# Patient Record
Sex: Female | Born: 1960 | Race: Black or African American | Hispanic: No | Marital: Single | Smoking: Former smoker
Health system: Southern US, Community
[De-identification: ages and names within clinical notes are randomized; demographics above are authoritative.]

## PROBLEM LIST (undated history)

## (undated) DIAGNOSIS — T148XXA Other injury of unspecified body region, initial encounter: Secondary | ICD-10-CM

## (undated) DIAGNOSIS — E559 Vitamin D deficiency, unspecified: Secondary | ICD-10-CM

## (undated) DIAGNOSIS — R05 Cough: Secondary | ICD-10-CM

## (undated) DIAGNOSIS — R519 Headache, unspecified: Secondary | ICD-10-CM

## (undated) DIAGNOSIS — E876 Hypokalemia: Secondary | ICD-10-CM

## (undated) DIAGNOSIS — R059 Cough, unspecified: Secondary | ICD-10-CM

## (undated) DIAGNOSIS — K5790 Diverticulosis of intestine, part unspecified, without perforation or abscess without bleeding: Secondary | ICD-10-CM

## (undated) DIAGNOSIS — I10 Essential (primary) hypertension: Secondary | ICD-10-CM

## (undated) DIAGNOSIS — S060X9A Concussion with loss of consciousness of unspecified duration, initial encounter: Secondary | ICD-10-CM

## (undated) DIAGNOSIS — K759 Inflammatory liver disease, unspecified: Secondary | ICD-10-CM

## (undated) DIAGNOSIS — F32A Depression, unspecified: Secondary | ICD-10-CM

## (undated) DIAGNOSIS — K219 Gastro-esophageal reflux disease without esophagitis: Secondary | ICD-10-CM

---
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
## 2010-01-18 DIAGNOSIS — S060X9A Concussion with loss of consciousness of unspecified duration, initial encounter: Secondary | ICD-10-CM

## 2010-01-18 DIAGNOSIS — S060XAA Concussion with loss of consciousness status unknown, initial encounter: Secondary | ICD-10-CM

## 2010-01-18 HISTORY — DX: Concussion with loss of consciousness status unknown, initial encounter: S06.0XAA

## 2010-01-18 HISTORY — DX: Concussion with loss of consciousness of unspecified duration, initial encounter: S06.0X9A

## 2018-04-07 ENCOUNTER — Encounter (HOSPITAL_BASED_OUTPATIENT_CLINIC_OR_DEPARTMENT_OTHER): Payer: Self-pay | Admitting: *Deleted

## 2018-04-07 ENCOUNTER — Emergency Department (HOSPITAL_BASED_OUTPATIENT_CLINIC_OR_DEPARTMENT_OTHER)
Admission: EM | Admit: 2018-04-07 | Discharge: 2018-04-07 | Disposition: A | Discharge status: Home / Self Care | Payer: Self-pay | Attending: Emergency Medicine | Admitting: Emergency Medicine

## 2018-04-07 ENCOUNTER — Other Ambulatory Visit: Payer: Self-pay

## 2018-04-07 DIAGNOSIS — L0291 Cutaneous abscess, unspecified: Secondary | ICD-10-CM

## 2018-04-07 DIAGNOSIS — K047 Periapical abscess without sinus: Secondary | ICD-10-CM | POA: Insufficient documentation

## 2018-04-07 MED ORDER — LIDOCAINE HCL URETHRAL/MUCOSAL 2 % EX GEL
1.0000 "application " | Freq: Once | CUTANEOUS | Status: AC
  Administered 2018-04-07: 1 via TOPICAL
  Filled 2018-04-07: qty 20

## 2018-04-07 MED ORDER — AMOXICILLIN-POT CLAVULANATE 875-125 MG PO TABS: 1.0000 | ORAL_TABLET | Freq: Two times a day (BID) | ORAL | 0 refills | Status: AC

## 2018-04-07 NOTE — Discharge Instructions (Addendum)
You were evaluated in the Emergency Department and after careful evaluation, we did not find any emergent condition requiring admission or further testing in the hospital.  Your symptoms today seem to be due to an abscess related to a tooth infection.  Please take the antibiotics as directed and follow-up with a dentist.  Please return to the Emergency Department if you experience any worsening of your condition.  We encourage you to follow up with a primary care provider.  Thank you for allowing [place] to be a part of your care.

## 2018-04-07 NOTE — ED Triage Notes (Addendum)
Pt stated that she has a abcess on the left side of her mouth since yesterday.

## 2018-04-07 NOTE — ED Provider Notes (Signed)
[facility] Emergency Department Provider Note MRN:  [id number]  Arrival date & time: 04/07/18     Chief Complaint   Abscess   History of Present Illness   Sandra Montoya is a 58 y.o. year-old female with a history of tobacco abuse presenting to the ED with chief complaint of abscess.  Worsening tooth pain and swelling for the past 2 days.  Pain is located in the left upper teeth and lip.  Has had tooth abscess in the past.  Denies fever, endorsing mild swelling to the left cheek, pain is constant, 8 out of 10, worse with motion or palpation.  Denies vision problems, no headache, no chest pain or shortness of breath, no abdominal pain.  Review of Systems  A complete 10 system review of systems was obtained and all systems are negative except as noted in the HPI and PMH.   Patient's Health History   History reviewed. No pertinent past medical history.  History reviewed. No pertinent surgical history.  History reviewed. No pertinent family history.  Social History   Socioeconomic History  . Marital status: Single    Spouse name: Not on file  . Number of children: Not on file  . Years of education: Not on file  . Highest education level: Not on file  Occupational History  . Not on file  Social Needs  . Financial resource strain: Not on file  . Food insecurity:    Worry: Not on file    Inability: Not on file  . Transportation needs:    Medical: Not on file    Non-medical: Not on file  Tobacco Use  . Smoking status: Current Every Day Smoker    Packs/day: 0.50  . Smokeless tobacco: Never Used  Substance and Sexual Activity  . Alcohol use: Yes    Alcohol/week: 5.0 standard drinks    Types: 5 Cans of beer per week  . Drug use: Not Currently  . Sexual activity: Not on file  Lifestyle  . Physical activity:    Days per week: Not on file    Minutes per session: Not on file  . Stress: Not on file  Relationships  . Social connections:    Talks on  phone: Not on file    Gets together: Not on file    Attends religious service: Not on file    Active member of club or organization: Not on file    Attends meetings of clubs or organizations: Not on file    Relationship status: Not on file  . Intimate partner violence:    Fear of current or ex partner: Not on file    Emotionally abused: Not on file    Physically abused: Not on file    Forced sexual activity: Not on file  Other Topics Concern  . Not on file  Social History Narrative  . Not on file     Physical Exam  Vital Signs and Nursing Notes reviewed Vitals:   04/07/18 1106 04/07/18 1107  BP:  134/75  Pulse:  86  Resp:  18  Temp: 98 F (36.7 C)   SpO2:  100%    CONSTITUTIONAL: Well-appearing, NAD NEURO:  Alert and oriented x 3, no focal deficits EYES:  eyes equal and reactive ENT/NECK:  no LAD, no JVD; tooth 6 is eroded and decayed, there is a small abscess to the left upper buccal mucosa CARDIO: Regular rate, well-perfused, normal S1 and S2 PULM:  CTAB no wheezing or rhonchi  GI/GU:  normal bowel sounds, non-distended, non-tender MSK/SPINE:  No gross deformities, no edema SKIN:  no rash, atraumatic PSYCH:  Appropriate speech and behavior  Diagnostic and Interventional Summary    Labs Reviewed - No data to display  No orders to display    Medications  lidocaine (XYLOCAINE) 2 % jelly 1 application (1 application Topical Given 04/07/18 1115)     .Marland KitchenIncision and Drainage Date/Time: 04/07/2018 11:12 AM Performed by: Sabas Sous, MD Authorized by: Sabas Sous, MD   Consent:    Consent obtained:  Verbal   Consent given by:  Patient   Risks discussed:  Incomplete drainage and bleeding Location:    Type:  Abscess   Size:  34mm   Location:  Mouth   Mouth location: buccal mucosa. Anesthesia (see MAR for exact dosages):    Anesthesia method:  Topical application   Topical anesthetic:  Lidocaine gel Procedure type:    Complexity:  Simple Procedure  details:    Incision types:  Stab incision   Incision depth:  Submucosal   Scalpel blade:  11   Drainage:  Bloody   Drainage amount:  Scant   Packing materials:  None Post-procedure details:    Patient tolerance of procedure:  Tolerated well, no immediate complications   Critical Care  ED Course and Medical Decision Making  I have reviewed the triage vital signs and the nursing notes.  Pertinent labs & imaging results that were available during my care of the patient were reviewed by me and considered in my medical decision making (see below for details).  Uncomplicated dental abscess in this 58 year old female with poor dentition at baseline.  Patient is without fever, no significant contiguous swelling, no airway concerns, nothing to suggest Ludwig's.  Incision and drainage performed as described above, prescription for Augmentin.  After the discussed management above, the patient was determined to be safe for discharge.  The patient was in agreement with this plan and all questions regarding their care were answered.  ED return precautions were discussed and the patient will return to the ED with any significant worsening of condition.  Elmer Sow. Pilar Plate, MD [facility] Emergency Medicine [facility] [email or web address]  Final Clinical Impressions(s) / ED Diagnoses     ICD-10-CM   1. Abscess L02.91     ED Discharge Orders         Ordered    amoxicillin-clavulanate (AUGMENTIN) 875-125 MG tablet  Every 12 hours     04/07/18 1134             Sabas Sous, MD 04/07/18 1135

## 2018-08-06 ENCOUNTER — Emergency Department (HOSPITAL_COMMUNITY): Payer: Self-pay

## 2018-08-06 ENCOUNTER — Inpatient Hospital Stay (HOSPITAL_COMMUNITY)
Admission: EM | Admit: 2018-08-06 | Discharge: 2018-08-09 | DRG: 492 | Disposition: A | Discharge status: Rehabilitation Facility | Payer: Self-pay | Attending: General Surgery | Admitting: General Surgery

## 2018-08-06 ENCOUNTER — Inpatient Hospital Stay (HOSPITAL_COMMUNITY): Payer: Self-pay

## 2018-08-06 DIAGNOSIS — S42322A Displaced transverse fracture of shaft of humerus, left arm, initial encounter for closed fracture: Principal | ICD-10-CM | POA: Diagnosis present

## 2018-08-06 DIAGNOSIS — Z20828 Contact with and (suspected) exposure to other viral communicable diseases: Secondary | ICD-10-CM | POA: Diagnosis present

## 2018-08-06 DIAGNOSIS — T148XXA Other injury of unspecified body region, initial encounter: Secondary | ICD-10-CM

## 2018-08-06 DIAGNOSIS — S3219XA Other fracture of sacrum, initial encounter for closed fracture: Secondary | ICD-10-CM | POA: Diagnosis present

## 2018-08-06 DIAGNOSIS — Z79899 Other long term (current) drug therapy: Secondary | ICD-10-CM

## 2018-08-06 DIAGNOSIS — Z880 Allergy status to penicillin: Secondary | ICD-10-CM

## 2018-08-06 DIAGNOSIS — Y92410 Unspecified street and highway as the place of occurrence of the external cause: Secondary | ICD-10-CM

## 2018-08-06 DIAGNOSIS — R402132 Coma scale, eyes open, to sound, at arrival to emergency department: Secondary | ICD-10-CM | POA: Diagnosis present

## 2018-08-06 DIAGNOSIS — I4581 Long QT syndrome: Secondary | ICD-10-CM | POA: Diagnosis present

## 2018-08-06 DIAGNOSIS — R471 Dysarthria and anarthria: Secondary | ICD-10-CM | POA: Diagnosis present

## 2018-08-06 DIAGNOSIS — S2249XA Multiple fractures of ribs, unspecified side, initial encounter for closed fracture: Secondary | ICD-10-CM

## 2018-08-06 DIAGNOSIS — E559 Vitamin D deficiency, unspecified: Secondary | ICD-10-CM | POA: Diagnosis not present

## 2018-08-06 DIAGNOSIS — F10129 Alcohol abuse with intoxication, unspecified: Secondary | ICD-10-CM | POA: Diagnosis present

## 2018-08-06 DIAGNOSIS — D62 Acute posthemorrhagic anemia: Secondary | ICD-10-CM | POA: Diagnosis present

## 2018-08-06 DIAGNOSIS — Z419 Encounter for procedure for purposes other than remedying health state, unspecified: Secondary | ICD-10-CM

## 2018-08-06 DIAGNOSIS — R402362 Coma scale, best motor response, obeys commands, at arrival to emergency department: Secondary | ICD-10-CM | POA: Diagnosis present

## 2018-08-06 DIAGNOSIS — Y908 Blood alcohol level of 240 mg/100 ml or more: Secondary | ICD-10-CM | POA: Diagnosis present

## 2018-08-06 DIAGNOSIS — R402252 Coma scale, best verbal response, oriented, at arrival to emergency department: Secondary | ICD-10-CM | POA: Diagnosis present

## 2018-08-06 DIAGNOSIS — I517 Cardiomegaly: Secondary | ICD-10-CM | POA: Diagnosis present

## 2018-08-06 DIAGNOSIS — S92061B Displaced intraarticular fracture of right calcaneus, initial encounter for open fracture: Secondary | ICD-10-CM | POA: Diagnosis present

## 2018-08-06 DIAGNOSIS — F17210 Nicotine dependence, cigarettes, uncomplicated: Secondary | ICD-10-CM | POA: Diagnosis present

## 2018-08-06 DIAGNOSIS — S2242XA Multiple fractures of ribs, left side, initial encounter for closed fracture: Secondary | ICD-10-CM | POA: Diagnosis present

## 2018-08-06 DIAGNOSIS — S32592B Other specified fracture of left pubis, initial encounter for open fracture: Secondary | ICD-10-CM

## 2018-08-06 DIAGNOSIS — S42302A Unspecified fracture of shaft of humerus, left arm, initial encounter for closed fracture: Secondary | ICD-10-CM

## 2018-08-06 DIAGNOSIS — Z23 Encounter for immunization: Secondary | ICD-10-CM

## 2018-08-06 DIAGNOSIS — S92001B Unspecified fracture of right calcaneus, initial encounter for open fracture: Secondary | ICD-10-CM

## 2018-08-06 HISTORY — DX: Essential (primary) hypertension: I10

## 2018-08-06 HISTORY — DX: Other injury of unspecified body region, initial encounter: T14.8XXA

## 2018-08-06 HISTORY — DX: Hypokalemia: E87.6

## 2018-08-06 LAB — TYPE AND SCREEN
ABO/RH(D): O NEG
Antibody Screen: NEGATIVE

## 2018-08-06 LAB — URINALYSIS, ROUTINE W REFLEX MICROSCOPIC
Bacteria, UA: NONE SEEN
Bilirubin Urine: NEGATIVE
Glucose, UA: NEGATIVE mg/dL
Ketones, ur: NEGATIVE mg/dL
Nitrite: NEGATIVE
Protein, ur: NEGATIVE mg/dL
Specific Gravity, Urine: 1.011 (ref 1.005–1.030)
pH: 5 (ref 5.0–8.0)

## 2018-08-06 LAB — COMPREHENSIVE METABOLIC PANEL
ALT: 40 U/L (ref 0–44)
AST: 72 U/L — ABNORMAL HIGH (ref 15–41)
Albumin: 3.6 g/dL (ref 3.5–5.0)
Alkaline Phosphatase: 58 U/L (ref 38–126)
Anion gap: 15 (ref 5–15)
BUN: 12 mg/dL (ref 6–20)
CO2: 14 mmol/L — ABNORMAL LOW (ref 22–32)
Calcium: 8 mg/dL — ABNORMAL LOW (ref 8.9–10.3)
Chloride: 104 mmol/L (ref 98–111)
Creatinine, Ser: 0.87 mg/dL (ref 0.44–1.00)
GFR calc Af Amer: >60 mL/min (ref >60)
GFR calc non Af Amer: >60 mL/min (ref >60)
Glucose, Bld: 112 mg/dL — ABNORMAL HIGH (ref 70–99)
Potassium: 2.8 mmol/L — ABNORMAL LOW (ref 3.5–5.1)
Sodium: 133 mmol/L — ABNORMAL LOW (ref 135–145)
Total Bilirubin: 0.6 mg/dL (ref 0.3–1.2)
Total Protein: 6.6 g/dL (ref 6.5–8.1)

## 2018-08-06 LAB — CBC
HCT: 34.4 % — ABNORMAL LOW (ref 36.0–46.0)
Hemoglobin: 11.1 g/dL — ABNORMAL LOW (ref 12.0–15.0)
MCH: 32 pg (ref 26.0–34.0)
MCHC: 32.3 g/dL (ref 30.0–36.0)
MCV: 99.1 fL (ref 80.0–100.0)
Platelets: 319 10*3/uL (ref 150–400)
RBC: 3.47 MIL/uL — ABNORMAL LOW (ref 3.87–5.11)
RDW: 13.5 % (ref 11.5–15.5)
WBC: 10.1 10*3/uL (ref 4.0–10.5)
nRBC: 0 % (ref 0.0–0.2)

## 2018-08-06 LAB — I-STAT CHEM 8, ED
BUN: 12 mg/dL (ref 6–20)
Calcium, Ion: 0.95 mmol/L — ABNORMAL LOW (ref 1.15–1.40)
Chloride: 107 mmol/L (ref 98–111)
Creatinine, Ser: 1.3 mg/dL — ABNORMAL HIGH (ref 0.44–1.00)
Glucose, Bld: 113 mg/dL — ABNORMAL HIGH (ref 70–99)
HCT: 36 % (ref 36.0–46.0)
Hemoglobin: 12.2 g/dL (ref 12.0–15.0)
Potassium: 2.9 mmol/L — ABNORMAL LOW (ref 3.5–5.1)
Sodium: 137 mmol/L (ref 135–145)
TCO2: 17 mmol/L — ABNORMAL LOW (ref 22–32)

## 2018-08-06 LAB — ETHANOL: Alcohol, Ethyl (B): 346 mg/dL (ref <10)

## 2018-08-06 LAB — PROTIME-INR
INR: 1.2 (ref 0.8–1.2)
Prothrombin Time: 15.2 seconds (ref 11.4–15.2)

## 2018-08-06 LAB — RAPID URINE DRUG SCREEN, HOSP PERFORMED
Amphetamines: NOT DETECTED
Barbiturates: NOT DETECTED
Benzodiazepines: NOT DETECTED
Cocaine: NOT DETECTED
Opiates: NOT DETECTED
Tetrahydrocannabinol: NOT DETECTED

## 2018-08-06 LAB — I-STAT BETA HCG BLOOD, ED (MC, WL, AP ONLY): I-stat hCG, quantitative: <5 m[IU]/mL (ref <5)

## 2018-08-06 LAB — LACTIC ACID, PLASMA: Lactic Acid, Venous: 2 mmol/L (ref 0.5–1.9)

## 2018-08-06 LAB — SARS CORONAVIRUS 2 BY RT PCR (HOSPITAL ORDER, PERFORMED IN ~~LOC~~ HOSPITAL LAB): SARS Coronavirus 2: NEGATIVE

## 2018-08-06 MED ORDER — LACTATED RINGERS IV BOLUS
1000.0000 mL | Freq: Once | INTRAVENOUS | Status: AC
  Administered 2018-08-07: 1000 mL via INTRAVENOUS

## 2018-08-06 MED ORDER — TETANUS-DIPHTH-ACELL PERTUSSIS 5-2.5-18.5 LF-MCG/0.5 IM SUSP: 0.5000 mL | Freq: Once | INTRAMUSCULAR | Status: DC

## 2018-08-06 MED ORDER — CEFAZOLIN SODIUM-DEXTROSE 2-4 GM/100ML-% IV SOLN: 2.0000 g | Freq: Once | INTRAVENOUS | Status: DC

## 2018-08-06 MED ORDER — TETANUS-DIPHTH-ACELL PERTUSSIS 5-2.5-18.5 LF-MCG/0.5 IM SUSP
0.5000 mL | Freq: Once | INTRAMUSCULAR | Status: AC
  Administered 2018-08-06: 0.5 mL via INTRAMUSCULAR
  Filled 2018-08-06: qty 0.5

## 2018-08-06 MED ORDER — CEFAZOLIN SODIUM-DEXTROSE 2-4 GM/100ML-% IV SOLN
2.0000 g | Freq: Once | INTRAVENOUS | Status: AC
  Administered 2018-08-06: 1 g via INTRAVENOUS
  Filled 2018-08-06: qty 100

## 2018-08-06 MED ORDER — IOHEXOL 300 MG/ML  SOLN
100.0000 mL | Freq: Once | INTRAMUSCULAR | Status: AC | PRN
  Administered 2018-08-06: 100 mL via INTRAVENOUS

## 2018-08-06 MED ORDER — CEFAZOLIN SODIUM-DEXTROSE 1-4 GM/50ML-% IV SOLN
1.0000 g | Freq: Once | INTRAVENOUS | Status: DC
  Filled 2018-08-06: qty 50
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
## 2018-08-06 NOTE — ED Notes (Signed)
Ortho paged and responded. 
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
## 2018-08-07 ENCOUNTER — Inpatient Hospital Stay (HOSPITAL_COMMUNITY): Payer: Self-pay

## 2018-08-07 ENCOUNTER — Inpatient Hospital Stay (HOSPITAL_COMMUNITY): Payer: Self-pay | Admitting: Anesthesiology

## 2018-08-07 ENCOUNTER — Encounter (HOSPITAL_COMMUNITY): Payer: Self-pay | Admitting: Anesthesiology

## 2018-08-07 ENCOUNTER — Encounter (HOSPITAL_COMMUNITY): Admission: EM | Disposition: A | Discharge status: Rehabilitation Facility | Payer: Self-pay | Source: Home / Self Care

## 2018-08-07 DIAGNOSIS — S2249XA Multiple fractures of ribs, unspecified side, initial encounter for closed fracture: Secondary | ICD-10-CM

## 2018-08-07 DIAGNOSIS — S32592B Other specified fracture of left pubis, initial encounter for open fracture: Secondary | ICD-10-CM

## 2018-08-07 DIAGNOSIS — S42302A Unspecified fracture of shaft of humerus, left arm, initial encounter for closed fracture: Secondary | ICD-10-CM

## 2018-08-07 DIAGNOSIS — S92001B Unspecified fracture of right calcaneus, initial encounter for open fracture: Secondary | ICD-10-CM

## 2018-08-07 HISTORY — PX: ORIF HUMERUS FRACTURE: SHX2126

## 2018-08-07 HISTORY — PX: ORIF CALCANEOUS FRACTURE: SHX5030

## 2018-08-07 HISTORY — PX: IRRIGATION AND DEBRIDEMENT BUTTOCKS: SHX6601

## 2018-08-07 HISTORY — PX: I & D EXTREMITY: SHX5045

## 2018-08-07 LAB — MRSA PCR SCREENING: MRSA by PCR: NEGATIVE

## 2018-08-07 LAB — CBC
HCT: 34.6 % — ABNORMAL LOW (ref 36.0–46.0)
Hemoglobin: 11.5 g/dL — ABNORMAL LOW (ref 12.0–15.0)
MCH: 32.2 pg (ref 26.0–34.0)
MCHC: 33.2 g/dL (ref 30.0–36.0)
MCV: 96.9 fL (ref 80.0–100.0)
Platelets: 299 10*3/uL (ref 150–400)
RBC: 3.57 MIL/uL — ABNORMAL LOW (ref 3.87–5.11)
RDW: 13.2 % (ref 11.5–15.5)
WBC: 12.5 10*3/uL — ABNORMAL HIGH (ref 4.0–10.5)
nRBC: 0 % (ref 0.0–0.2)

## 2018-08-07 LAB — CDS SEROLOGY

## 2018-08-07 LAB — HIV ANTIBODY (ROUTINE TESTING W REFLEX): HIV Screen 4th Generation wRfx: NONREACTIVE

## 2018-08-07 LAB — ABO/RH: ABO/RH(D): O NEG

## 2018-08-07 SURGERY — IRRIGATION AND DEBRIDEMENT EXTREMITY
Anesthesia: General | Site: Foot | Laterality: Right

## 2018-08-07 MED ORDER — HYDROMORPHONE HCL 1 MG/ML IJ SOLN
1.0000 mg | INTRAMUSCULAR | Status: DC | PRN
  Administered 2018-08-07 – 2018-08-09 (×4): 1 mg via INTRAVENOUS
  Filled 2018-08-07 (×4): qty 1

## 2018-08-07 MED ORDER — MIDAZOLAM HCL 2 MG/2ML IJ SOLN
INTRAMUSCULAR | Status: AC
  Filled 2018-08-07: qty 2

## 2018-08-07 MED ORDER — ALBUMIN HUMAN 5 % IV SOLN
INTRAVENOUS | Status: DC | PRN
  Administered 2018-08-07: 10:00:00 via INTRAVENOUS

## 2018-08-07 MED ORDER — OXYCODONE HCL 5 MG PO TABS
5.0000 mg | ORAL_TABLET | ORAL | Status: DC | PRN
  Filled 2018-08-07 (×3): qty 2

## 2018-08-07 MED ORDER — SUCCINYLCHOLINE CHLORIDE 200 MG/10ML IV SOSY
PREFILLED_SYRINGE | INTRAVENOUS | Status: DC | PRN
  Administered 2018-08-07: 140 mg via INTRAVENOUS

## 2018-08-07 MED ORDER — SODIUM CHLORIDE 0.9 % IV SOLN
2.0000 g | INTRAVENOUS | Status: AC
  Administered 2018-08-07 – 2018-08-09 (×3): 2 g via INTRAVENOUS
  Filled 2018-08-07: qty 20
  Filled 2018-08-07 (×2): qty 2

## 2018-08-07 MED ORDER — CEFAZOLIN SODIUM-DEXTROSE 2-3 GM-%(50ML) IV SOLR
INTRAVENOUS | Status: DC | PRN
  Administered 2018-08-07: 2 g via INTRAVENOUS

## 2018-08-07 MED ORDER — ENOXAPARIN SODIUM 40 MG/0.4ML ~~LOC~~ SOLN
40.0000 mg | SUBCUTANEOUS | Status: DC
  Administered 2018-08-08 – 2018-08-09 (×2): 40 mg via SUBCUTANEOUS
  Filled 2018-08-07 (×2): qty 0.4

## 2018-08-07 MED ORDER — ONDANSETRON HCL 4 MG/2ML IJ SOLN
INTRAMUSCULAR | Status: DC | PRN
  Administered 2018-08-07: 4 mg via INTRAVENOUS

## 2018-08-07 MED ORDER — VANCOMYCIN HCL 1000 MG IV SOLR
INTRAVENOUS | Status: AC
  Filled 2018-08-07: qty 1000

## 2018-08-07 MED ORDER — LACTATED RINGERS IV SOLN
INTRAVENOUS | Status: DC
  Administered 2018-08-07 – 2018-08-09 (×5): via INTRAVENOUS

## 2018-08-07 MED ORDER — OXYCODONE HCL 5 MG PO TABS
ORAL_TABLET | ORAL | Status: AC
  Filled 2018-08-07: qty 1

## 2018-08-07 MED ORDER — THIAMINE HCL 100 MG/ML IJ SOLN: 100.0000 mg | Freq: Every day | INTRAMUSCULAR | Status: DC

## 2018-08-07 MED ORDER — PHENYLEPHRINE 40 MCG/ML (10ML) SYRINGE FOR IV PUSH (FOR BLOOD PRESSURE SUPPORT)
PREFILLED_SYRINGE | INTRAVENOUS | Status: AC
  Filled 2018-08-07: qty 10

## 2018-08-07 MED ORDER — LACTATED RINGERS IV SOLN
INTRAVENOUS | Status: DC | PRN
  Administered 2018-08-07 (×2): via INTRAVENOUS

## 2018-08-07 MED ORDER — ENOXAPARIN SODIUM 40 MG/0.4ML ~~LOC~~ SOLN: 40.0000 mg | Freq: Every day | SUBCUTANEOUS | Status: DC

## 2018-08-07 MED ORDER — CEFAZOLIN SODIUM-DEXTROSE 2-4 GM/100ML-% IV SOLN
2.0000 g | Freq: Once | INTRAVENOUS | Status: DC
  Filled 2018-08-07: qty 100

## 2018-08-07 MED ORDER — ACETAMINOPHEN 325 MG PO TABS
650.0000 mg | ORAL_TABLET | Freq: Four times a day (QID) | ORAL | Status: DC
  Administered 2018-08-08 – 2018-08-09 (×7): 650 mg via ORAL
  Filled 2018-08-07 (×8): qty 2

## 2018-08-07 MED ORDER — OXYCODONE HCL 5 MG PO TABS
10.0000 mg | ORAL_TABLET | ORAL | Status: DC | PRN
  Administered 2018-08-07 – 2018-08-08 (×3): 10 mg via ORAL
  Administered 2018-08-08: 15 mg via ORAL
  Administered 2018-08-08 – 2018-08-09 (×4): 10 mg via ORAL
  Filled 2018-08-07: qty 3
  Filled 2018-08-07 (×5): qty 2

## 2018-08-07 MED ORDER — DEXAMETHASONE SODIUM PHOSPHATE 10 MG/ML IJ SOLN
INTRAMUSCULAR | Status: DC | PRN
  Administered 2018-08-07: 8 mg via INTRAVENOUS

## 2018-08-07 MED ORDER — FENTANYL CITRATE (PF) 100 MCG/2ML IJ SOLN
25.0000 ug | INTRAMUSCULAR | Status: DC | PRN
  Administered 2018-08-07 (×3): 50 ug via INTRAVENOUS

## 2018-08-07 MED ORDER — ADULT MULTIVITAMIN W/MINERALS CH
1.0000 | ORAL_TABLET | Freq: Every day | ORAL | Status: DC
  Administered 2018-08-08 – 2018-08-09 (×2): 1 via ORAL
  Filled 2018-08-07 (×2): qty 1

## 2018-08-07 MED ORDER — ROCURONIUM BROMIDE 10 MG/ML (PF) SYRINGE
PREFILLED_SYRINGE | INTRAVENOUS | Status: AC
  Filled 2018-08-07: qty 10

## 2018-08-07 MED ORDER — ONDANSETRON HCL 4 MG/2ML IJ SOLN: 4.0000 mg | Freq: Four times a day (QID) | INTRAMUSCULAR | Status: DC | PRN

## 2018-08-07 MED ORDER — MIDAZOLAM HCL 5 MG/5ML IJ SOLN
INTRAMUSCULAR | Status: DC | PRN
  Administered 2018-08-07 (×2): 1 mg via INTRAVENOUS

## 2018-08-07 MED ORDER — OXYCODONE HCL 5 MG PO TABS: 10.0000 mg | ORAL_TABLET | Freq: Four times a day (QID) | ORAL | Status: DC | PRN

## 2018-08-07 MED ORDER — ROCURONIUM BROMIDE 10 MG/ML (PF) SYRINGE
PREFILLED_SYRINGE | INTRAVENOUS | Status: DC | PRN
  Administered 2018-08-07: 20 mg via INTRAVENOUS
  Administered 2018-08-07: 50 mg via INTRAVENOUS
  Administered 2018-08-07: 20 mg via INTRAVENOUS

## 2018-08-07 MED ORDER — PROPOFOL 10 MG/ML IV BOLUS
INTRAVENOUS | Status: DC | PRN
  Administered 2018-08-07: 140 mg via INTRAVENOUS

## 2018-08-07 MED ORDER — IBUPROFEN 200 MG PO TABS: 600.0000 mg | ORAL_TABLET | Freq: Four times a day (QID) | ORAL | Status: DC | PRN

## 2018-08-07 MED ORDER — LIDOCAINE 2% (20 MG/ML) 5 ML SYRINGE
INTRAMUSCULAR | Status: DC | PRN
  Administered 2018-08-07: 100 mg via INTRAVENOUS

## 2018-08-07 MED ORDER — DEXMEDETOMIDINE HCL IN NACL 200 MCG/50ML IV SOLN
0.2000 ug/kg/h | INTRAVENOUS | Status: DC
  Filled 2018-08-07: qty 50

## 2018-08-07 MED ORDER — PROPOFOL 10 MG/ML IV BOLUS
INTRAVENOUS | Status: AC
  Filled 2018-08-07: qty 20

## 2018-08-07 MED ORDER — TOBRAMYCIN SULFATE 1.2 G IJ SOLR
INTRAMUSCULAR | Status: DC | PRN
  Administered 2018-08-07 (×2): 1.2 g via TOPICAL

## 2018-08-07 MED ORDER — 0.9 % SODIUM CHLORIDE (POUR BTL) OPTIME
TOPICAL | Status: DC | PRN
  Administered 2018-08-07: 1000 mL
  Administered 2018-08-07: 6000 mL

## 2018-08-07 MED ORDER — OXYCODONE HCL 5 MG/5ML PO SOLN: 5.0000 mg | Freq: Once | ORAL | Status: AC | PRN

## 2018-08-07 MED ORDER — TOBRAMYCIN SULFATE 1.2 G IJ SOLR
INTRAMUSCULAR | Status: AC
  Filled 2018-08-07: qty 1.2

## 2018-08-07 MED ORDER — FOLIC ACID 1 MG PO TABS
1.0000 mg | ORAL_TABLET | Freq: Every day | ORAL | Status: DC
  Administered 2018-08-08 – 2018-08-09 (×2): 1 mg via ORAL
  Filled 2018-08-07 (×2): qty 1

## 2018-08-07 MED ORDER — HYDROMORPHONE HCL 1 MG/ML IJ SOLN: 0.5000 mg | INTRAMUSCULAR | Status: DC | PRN

## 2018-08-07 MED ORDER — BACITRACIN ZINC 500 UNIT/GM EX OINT
TOPICAL_OINTMENT | CUTANEOUS | Status: AC
  Filled 2018-08-07: qty 28.35

## 2018-08-07 MED ORDER — DOCUSATE SODIUM 100 MG PO CAPS
200.0000 mg | ORAL_CAPSULE | Freq: Two times a day (BID) | ORAL | Status: DC
  Administered 2018-08-07 – 2018-08-09 (×4): 200 mg via ORAL
  Filled 2018-08-07 (×4): qty 2

## 2018-08-07 MED ORDER — FENTANYL CITRATE (PF) 250 MCG/5ML IJ SOLN
INTRAMUSCULAR | Status: DC | PRN
  Administered 2018-08-07: 100 ug via INTRAVENOUS
  Administered 2018-08-07 (×2): 50 ug via INTRAVENOUS
  Administered 2018-08-07 (×2): 25 ug via INTRAVENOUS

## 2018-08-07 MED ORDER — ONDANSETRON HCL 4 MG/2ML IJ SOLN
INTRAMUSCULAR | Status: AC
  Filled 2018-08-07: qty 2

## 2018-08-07 MED ORDER — DEXAMETHASONE SODIUM PHOSPHATE 10 MG/ML IJ SOLN
INTRAMUSCULAR | Status: AC
  Filled 2018-08-07: qty 1

## 2018-08-07 MED ORDER — VITAMIN B-1 100 MG PO TABS
100.0000 mg | ORAL_TABLET | Freq: Every day | ORAL | Status: DC
  Administered 2018-08-08 – 2018-08-09 (×2): 100 mg via ORAL
  Filled 2018-08-07 (×2): qty 1

## 2018-08-07 MED ORDER — LORAZEPAM 2 MG/ML IJ SOLN: 1.0000 mg | INTRAMUSCULAR | Status: DC | PRN

## 2018-08-07 MED ORDER — LIDOCAINE-EPINEPHRINE (PF) 2 %-1:200000 IJ SOLN
20.0000 mL | Freq: Once | INTRAMUSCULAR | Status: AC
  Administered 2018-08-07: 20 mL via INTRADERMAL
  Filled 2018-08-07: qty 20

## 2018-08-07 MED ORDER — FENTANYL CITRATE (PF) 250 MCG/5ML IJ SOLN
INTRAMUSCULAR | Status: AC
  Filled 2018-08-07: qty 5

## 2018-08-07 MED ORDER — VANCOMYCIN HCL 1000 MG IV SOLR
INTRAVENOUS | Status: DC | PRN
  Administered 2018-08-07 (×2): 1000 mg via TOPICAL

## 2018-08-07 MED ORDER — OXYCODONE HCL 5 MG PO TABS
5.0000 mg | ORAL_TABLET | Freq: Once | ORAL | Status: AC | PRN
  Administered 2018-08-07: 12:00:00 5 mg via ORAL

## 2018-08-07 MED ORDER — ONDANSETRON 4 MG PO TBDP: 4.0000 mg | ORAL_TABLET | Freq: Four times a day (QID) | ORAL | Status: DC | PRN

## 2018-08-07 MED ORDER — ONDANSETRON HCL 4 MG/2ML IJ SOLN: 4.0000 mg | Freq: Once | INTRAMUSCULAR | Status: DC | PRN

## 2018-08-07 MED ORDER — SUGAMMADEX SODIUM 200 MG/2ML IV SOLN
INTRAVENOUS | Status: DC | PRN
  Administered 2018-08-07: 200 mg via INTRAVENOUS

## 2018-08-07 MED ORDER — LIDOCAINE 2% (20 MG/ML) 5 ML SYRINGE
INTRAMUSCULAR | Status: AC
  Filled 2018-08-07: qty 5

## 2018-08-07 MED ORDER — FENTANYL CITRATE (PF) 100 MCG/2ML IJ SOLN
INTRAMUSCULAR | Status: AC
  Filled 2018-08-07: qty 2

## 2018-08-07 MED ORDER — HYDRALAZINE HCL 20 MG/ML IJ SOLN: 10.0000 mg | INTRAMUSCULAR | Status: DC | PRN

## 2018-08-07 MED ORDER — LORAZEPAM 2 MG/ML IJ SOLN: 1.0000 mg | Freq: Four times a day (QID) | INTRAMUSCULAR | Status: DC | PRN

## 2018-08-07 MED ORDER — ALUM & MAG HYDROXIDE-SIMETH 200-200-20 MG/5ML PO SUSP
30.0000 mL | ORAL | Status: DC | PRN
  Administered 2018-08-07 – 2018-08-09 (×6): 30 mL via ORAL
  Filled 2018-08-07 (×6): qty 30

## 2018-08-07 MED ORDER — SUCCINYLCHOLINE CHLORIDE 200 MG/10ML IV SOSY
PREFILLED_SYRINGE | INTRAVENOUS | Status: AC
  Filled 2018-08-07: qty 10

## 2018-08-07 MED ORDER — FENTANYL CITRATE (PF) 100 MCG/2ML IJ SOLN
INTRAMUSCULAR | Status: AC | PRN
  Administered 2018-08-07: 50 ug via INTRAVENOUS

## 2018-08-07 MED ORDER — POTASSIUM CHLORIDE 10 MEQ/100ML IV SOLN
10.0000 meq | INTRAVENOUS | Status: AC
  Administered 2018-08-07 (×6): 10 meq via INTRAVENOUS
  Filled 2018-08-07 (×6): qty 100

## 2018-08-07 MED ORDER — LORAZEPAM 1 MG PO TABS: 1.0000 mg | ORAL_TABLET | Freq: Four times a day (QID) | ORAL | Status: DC | PRN

## 2018-08-07 MED ORDER — PNEUMOCOCCAL VAC POLYVALENT 25 MCG/0.5ML IJ INJ
0.5000 mL | INJECTION | INTRAMUSCULAR | Status: AC
  Administered 2018-08-08: 13:00:00 0.5 mL via INTRAMUSCULAR
  Filled 2018-08-07: qty 0.5

## 2018-08-07 SURGICAL SUPPLY — 92 items
BIT DRILL CANN LRG QC 5X300 (BIT) ×2 IMPLANT
BIT DRILL Q/COUPLING 1 (BIT) ×2 IMPLANT
BLADE AVERAGE 25MMX9MM (BLADE)
BLADE AVERAGE 25X9 (BLADE) IMPLANT
BNDG COHESIVE 4X5 TAN STRL (GAUZE/BANDAGES/DRESSINGS) ×4 IMPLANT
BNDG ELASTIC 4X5.8 VLCR STR LF (GAUZE/BANDAGES/DRESSINGS) ×2 IMPLANT
BNDG ESMARK 4X9 LF (GAUZE/BANDAGES/DRESSINGS) ×4 IMPLANT
BNDG GAUZE ELAST 4 BULKY (GAUZE/BANDAGES/DRESSINGS) ×8 IMPLANT
BRUSH SCRUB EZ PLAIN DRY (MISCELLANEOUS) ×12 IMPLANT
CANISTER WOUND CARE 500ML ATS (WOUND CARE) ×2 IMPLANT
CHLORAPREP W/TINT 26 (MISCELLANEOUS) ×10 IMPLANT
CORD BIPOLAR FORCEPS 12FT (ELECTRODE) ×6 IMPLANT
COVER MAYO STAND STRL (DRAPES) ×6 IMPLANT
COVER SURGICAL LIGHT HANDLE (MISCELLANEOUS) ×12 IMPLANT
COVER WAND RF STERILE (DRAPES) ×4 IMPLANT
DRAIN PENROSE 1/4X12 LTX STRL (WOUND CARE) IMPLANT
DRAPE C-ARM 42X72 X-RAY (DRAPES) ×8 IMPLANT
DRAPE C-ARMOR (DRAPES) ×4 IMPLANT
DRAPE HALF SHEET 40X57 (DRAPES) ×8 IMPLANT
DRAPE INCISE IOBAN 66X45 STRL (DRAPES) IMPLANT
DRAPE ORTHO SPLIT 77X108 STRL (DRAPES) ×4
DRAPE SURG 17X23 STRL (DRAPES) ×4 IMPLANT
DRAPE SURG ORHT 6 SPLT 77X108 (DRAPES) ×4 IMPLANT
DRAPE U-SHAPE 47X51 STRL (DRAPES) ×12 IMPLANT
DRESSING PEEL AND PLC PRVNA 13 (GAUZE/BANDAGES/DRESSINGS) IMPLANT
DRSG ADAPTIC 3X8 NADH LF (GAUZE/BANDAGES/DRESSINGS) ×4 IMPLANT
DRSG MEPILEX BORDER 4X8 (GAUZE/BANDAGES/DRESSINGS) ×2 IMPLANT
DRSG PAD ABDOMINAL 8X10 ST (GAUZE/BANDAGES/DRESSINGS) ×4 IMPLANT
DRSG PEEL AND PLACE PREVENA 13 (GAUZE/BANDAGES/DRESSINGS) ×6
DRSG TEGADERM 4X4.75 (GAUZE/BANDAGES/DRESSINGS) ×2 IMPLANT
ELECT REM PT RETURN 9FT ADLT (ELECTROSURGICAL) ×6
ELECTRODE REM PT RTRN 9FT ADLT (ELECTROSURGICAL) ×4 IMPLANT
EVACUATOR 1/8 PVC DRAIN (DRAIN) IMPLANT
GAUZE SPONGE 4X4 12PLY STRL (GAUZE/BANDAGES/DRESSINGS) ×8 IMPLANT
GLOVE BIO SURGEON STRL SZ 6.5 (GLOVE) ×14 IMPLANT
GLOVE BIO SURGEON STRL SZ7.5 (GLOVE) ×28 IMPLANT
GLOVE BIO SURGEONS STRL SZ 6.5 (GLOVE) ×2
GLOVE BIOGEL PI IND STRL 6.5 (GLOVE) ×4 IMPLANT
GLOVE BIOGEL PI IND STRL 7.5 (GLOVE) ×4 IMPLANT
GLOVE BIOGEL PI IND STRL 8 (GLOVE) ×4 IMPLANT
GLOVE BIOGEL PI INDICATOR 6.5 (GLOVE) ×4
GLOVE BIOGEL PI INDICATOR 7.5 (GLOVE) ×4
GLOVE BIOGEL PI INDICATOR 8 (GLOVE)
GOWN STRL REUS W/ TWL LRG LVL3 (GOWN DISPOSABLE) ×8 IMPLANT
GOWN STRL REUS W/ TWL XL LVL3 (GOWN DISPOSABLE) ×4 IMPLANT
GOWN STRL REUS W/TWL LRG LVL3 (GOWN DISPOSABLE) ×8
GOWN STRL REUS W/TWL XL LVL3 (GOWN DISPOSABLE) ×2
GUIDEWIRE THREADED 2.8 (WIRE) ×6 IMPLANT
HANDPIECE INTERPULSE COAX TIP (DISPOSABLE)
KIT BASIN OR (CUSTOM PROCEDURE TRAY) ×6 IMPLANT
KIT PREVENA INCISION MGT 13 (CANNISTER) ×2 IMPLANT
KIT TURNOVER KIT B (KITS) ×6 IMPLANT
MANIFOLD NEPTUNE II (INSTRUMENTS) ×8 IMPLANT
NDL HYPO 25X1 1.5 SAFETY (NEEDLE) ×4 IMPLANT
NEEDLE HYPO 25X1 1.5 SAFETY (NEEDLE) ×6 IMPLANT
NS IRRIG 1000ML POUR BTL (IV SOLUTION) ×6 IMPLANT
PACK ORTHO EXTREMITY (CUSTOM PROCEDURE TRAY) ×6 IMPLANT
PAD ARMBOARD 7.5X6 YLW CONV (MISCELLANEOUS) ×12 IMPLANT
PAD CAST 4YDX4 CTTN HI CHSV (CAST SUPPLIES) IMPLANT
PADDING CAST COTTON 4X4 STRL (CAST SUPPLIES) ×2
PADDING CAST COTTON 6X4 STRL (CAST SUPPLIES) ×4 IMPLANT
PLATE LOCKING 8 HOLE (Plate) ×2 IMPLANT
SCREW CANN 32MM 6.5X60MM (Screw) ×2 IMPLANT
SCREW CANN 32MM 6.5X70MM (Screw) ×2 IMPLANT
SCREW CORTEX ST 4.5X24 (Screw) ×2 IMPLANT
SCREW CORTEX ST 4.5X26 (Screw) ×8 IMPLANT
SCREW CORTEX ST 4.5X28 (Screw) ×2 IMPLANT
SET HNDPC FAN SPRY TIP SCT (DISPOSABLE) IMPLANT
SPONGE LAP 18X18 RF (DISPOSABLE) ×8 IMPLANT
STAPLER VISISTAT 35W (STAPLE) ×6 IMPLANT
STOCKINETTE IMPERVIOUS 9X36 MD (GAUZE/BANDAGES/DRESSINGS) IMPLANT
SUCTION FRAZIER HANDLE 10FR (MISCELLANEOUS) ×2
SUCTION TUBE FRAZIER 10FR DISP (MISCELLANEOUS) ×4 IMPLANT
SUT ETHILON 2 0 FS 18 (SUTURE) ×12 IMPLANT
SUT ETHILON 3 0 PS 1 (SUTURE) ×12 IMPLANT
SUT MON AB 2-0 CT1 36 (SUTURE) ×6 IMPLANT
SUT PDS AB 0 CT 36 (SUTURE) IMPLANT
SUT VIC AB 0 CT1 27 (SUTURE)
SUT VIC AB 0 CT1 27XBRD ANBCTR (SUTURE) ×8 IMPLANT
SUT VIC AB 2-0 CT1 27 (SUTURE)
SUT VIC AB 2-0 CT1 TAPERPNT 27 (SUTURE) ×8 IMPLANT
SWAB CULTURE ESWAB REG 1ML (MISCELLANEOUS) IMPLANT
SYR 5ML LL (SYRINGE) IMPLANT
SYR CONTROL 10ML LL (SYRINGE) ×4 IMPLANT
TOWEL GREEN STERILE (TOWEL DISPOSABLE) ×18 IMPLANT
TOWEL GREEN STERILE FF (TOWEL DISPOSABLE) ×6 IMPLANT
TRAY FOLEY MTR SLVR 16FR STAT (SET/KITS/TRAYS/PACK) IMPLANT
TUBE CONNECTING 12'X1/4 (SUCTIONS) ×2
TUBE CONNECTING 12X1/4 (SUCTIONS) ×6 IMPLANT
UNDERPAD 30X30 (UNDERPADS AND DIAPERS) ×6 IMPLANT
WATER STERILE IRR 1000ML POUR (IV SOLUTION) ×6 IMPLANT
YANKAUER SUCT BULB TIP NO VENT (SUCTIONS) ×8 IMPLANT

## 2018-08-07 NOTE — Transfer of Care (Signed)
Immediate Anesthesia Transfer of Care Note  Patient: Sandra Montoya  Procedure(s) Performed: IRRIGATION AND DEBRIDEMENT EXTREMITY (Right Foot) OPEN REDUCTION INTERNAL FIXATION (ORIF) DISTAL HUMERUS FRACTURE (Left Arm Upper) Open Reduction Internal Fixation (Orif) Calcaneous Fracture (Right Ankle) Irrigation And Debridement Buttocks (N/A )  Patient Location: PACU  Anesthesia Type:General  Level of Consciousness: awake, alert  and oriented  Airway & Oxygen Therapy: Patient Spontanous Breathing and Patient connected to nasal cannula oxygen  Post-op Assessment: Report given to RN and Post -op Vital signs reviewed and stable  Post vital signs: Reviewed and stable  Last Vitals:  Vitals Value Taken Time  BP 153/79 08/07/18 1144  Temp    Pulse 87 08/07/18 1146  Resp 17 08/07/18 1146  SpO2 100 % 08/07/18 1146  Vitals shown include unvalidated device data.  Last Pain:  Vitals:   08/07/18 0507  TempSrc: Oral  PainSc:          Complications: No apparent anesthesia complications

## 2018-08-07 NOTE — Anesthesia Preprocedure Evaluation (Signed)
Anesthesia Evaluation  Patient identified by MRN, date of birth, ID band Patient confused    Reviewed: Allergy & Precautions, NPO status , Patient's Chart, lab work & pertinent test results  Airway Mallampati: II   Neck ROM: Limited    Dental  (+) Dental Advisory Given, Teeth Intact   Pulmonary Current Smoker,    breath sounds clear to auscultation       Cardiovascular  Rhythm:Regular Rate:Normal     Neuro/Psych    GI/Hepatic   Endo/Other    Renal/GU      Musculoskeletal   Abdominal   Peds  Hematology   Anesthesia Other Findings   Reproductive/Obstetrics                             Anesthesia Physical Anesthesia Plan  ASA: III and emergent  Anesthesia Plan: General   Post-op Pain Management:    Induction: Intravenous  PONV Risk Score and Plan:   Airway Management Planned:   Additional Equipment:   Intra-op Plan:   Post-operative Plan: Extubation in OR  Informed Consent: I have reviewed the patients History and Physical, chart, labs and discussed the procedure including the risks, benefits and alternatives for the proposed anesthesia with the patient or authorized representative who has indicated his/her understanding and acceptance.     Dental advisory given  Plan Discussed with: CRNA and Anesthesiologist  Anesthesia Plan Comments:         Anesthesia Quick Evaluation

## 2018-08-07 NOTE — Progress Notes (Signed)
Attempted video chat using phone number and email, unsuccessful. Called family member at home with instructions.

## 2018-08-07 NOTE — Progress Notes (Signed)
The patient's daughter requested the waiting room volunteer call up to the unit and  the family needing the patient's purse with her keys, so that they could tend to her dog and house.  Pearline Yerby (daughter) was given the patient's black purse.

## 2018-08-07 NOTE — Progress Notes (Signed)
OT Cancellation Note  Patient Details Name: Sandra Montoya MRN: [id number] DOB: 09/18/1960   Cancelled Treatment:    Reason Eval/Treat Not Completed: Patient at procedure or test/ unavailable(in OR for I&D)  Sandra Montoya 08/07/2018, 8:26 AM   Sandra Montoya OTR/L Acute Rehabilitation Services Pager: [phone] Office: [phone]

## 2018-08-07 NOTE — Progress Notes (Signed)
PT Cancellation Note  Patient Details Name: Sandra Montoya MRN: [id number] DOB: April 28, 1960   Cancelled Treatment:    Reason Eval/Treat Not Completed: Patient at procedure or test/unavailable Pt currently in OR. Will follow up as schedule allows.   Leighton Ruff, PT, DPT  Acute Rehabilitation Services  Pager: [phone] Office: [phone]  Rudean Hitt 08/07/2018, 10:30 AM
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
## 2018-08-07 NOTE — Progress Notes (Signed)
Talked with pt;s daughter and unable to do video chat via her phone or email. Discussed with Marlowe Kays RN at bedside for pt's daughter to call her (patient) on the phone.
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
## 2018-08-07 NOTE — Anesthesia Procedure Notes (Signed)
Procedure Name: Intubation Date/Time: 08/07/2018 7:54 AM Performed by: Marsa Aris, CRNA Pre-anesthesia Checklist: Patient identified, Emergency Drugs available, Suction available and Patient being monitored Patient Re-evaluated:Patient Re-evaluated prior to induction Oxygen Delivery Method: Circle System Utilized Preoxygenation: Pre-oxygenation with 100% oxygen Induction Type: IV induction and Rapid sequence Laryngoscope Size: Glidescope and 3 Grade View: Grade I Tube type: Oral Tube size: 7.0 mm Number of attempts: 1 Airway Equipment and Method: Stylet and Bite block Placement Confirmation: ETT inserted through vocal cords under direct vision,  positive ETCO2 and breath sounds checked- equal and bilateral Secured at: 21 cm Tube secured with: Tape Dental Injury: Teeth and Oropharynx as per pre-operative assessment  Comments: C-spine neutrality maintained - no change in dentition from pre-procedure

## 2018-08-07 NOTE — Anesthesia Postprocedure Evaluation (Signed)
Anesthesia Post Note  Patient: Sandra Montoya  Procedure(s) Performed: IRRIGATION AND DEBRIDEMENT EXTREMITY (Right Foot) OPEN REDUCTION INTERNAL FIXATION (ORIF) DISTAL HUMERUS FRACTURE (Left Arm Upper) Open Reduction Internal Fixation (Orif) Calcaneous Fracture (Right Ankle) Irrigation And Debridement Buttocks (N/A )     Patient location during evaluation: PACU Anesthesia Type: General Level of consciousness: sedated, patient cooperative, oriented and lethargic Pain management: pain level controlled Vital Signs Assessment: post-procedure vital signs reviewed and stable Respiratory status: spontaneous breathing, nonlabored ventilation, respiratory function stable and patient connected to nasal cannula oxygen Cardiovascular status: blood pressure returned to baseline and stable Postop Assessment: no apparent nausea or vomiting Anesthetic complications: no    Last Vitals:  Vitals:   08/07/18 1230 08/07/18 1240  BP: (!) 166/83 (!) 166/84  Pulse: 80 87  Resp: 16 16  Temp:  (!) 36.2 C  SpO2: 97% 92%    Last Pain:  Vitals:   08/07/18 1240  TempSrc:   PainSc: Asleep        RLE Motor Response: Purposeful movement;Responds to commands (08/07/18 1240)        Kyaira Trantham COKER
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
## 2018-08-08 ENCOUNTER — Encounter (HOSPITAL_COMMUNITY): Payer: Self-pay | Admitting: Student

## 2018-08-08 ENCOUNTER — Inpatient Hospital Stay (HOSPITAL_COMMUNITY): Payer: Self-pay

## 2018-08-08 LAB — CBC
HCT: 25.5 % — ABNORMAL LOW (ref 36.0–46.0)
Hemoglobin: 8.5 g/dL — ABNORMAL LOW (ref 12.0–15.0)
MCH: 32.1 pg (ref 26.0–34.0)
MCHC: 33.3 g/dL (ref 30.0–36.0)
MCV: 96.2 fL (ref 80.0–100.0)
Platelets: 267 10*3/uL (ref 150–400)
RBC: 2.65 MIL/uL — ABNORMAL LOW (ref 3.87–5.11)
RDW: 13.3 % (ref 11.5–15.5)
WBC: 11.2 10*3/uL — ABNORMAL HIGH (ref 4.0–10.5)
nRBC: 0 % (ref 0.0–0.2)

## 2018-08-08 LAB — VITAMIN D 25 HYDROXY (VIT D DEFICIENCY, FRACTURES): Vit D, 25-Hydroxy: 15.1 ng/mL — ABNORMAL LOW (ref 30.0–100.0)

## 2018-08-08 MED ORDER — GABAPENTIN 600 MG PO TABS
300.0000 mg | ORAL_TABLET | Freq: Three times a day (TID) | ORAL | Status: DC
  Administered 2018-08-08 – 2018-08-09 (×4): 300 mg via ORAL
  Filled 2018-08-08 (×2): qty 1
  Filled 2018-08-08 (×3): qty 0.5

## 2018-08-08 MED ORDER — CHLORHEXIDINE GLUCONATE CLOTH 2 % EX PADS
6.0000 | MEDICATED_PAD | Freq: Every day | CUTANEOUS | Status: DC
  Administered 2018-08-08 – 2018-08-09 (×2): 6 via TOPICAL

## 2018-08-08 MED ORDER — METHOCARBAMOL 500 MG PO TABS
750.0000 mg | ORAL_TABLET | Freq: Three times a day (TID) | ORAL | Status: DC | PRN
  Administered 2018-08-08 – 2018-08-09 (×3): 750 mg via ORAL
  Filled 2018-08-08 (×3): qty 2

## 2018-08-08 NOTE — Progress Notes (Signed)
Rehab Admissions Coordinator Note:  Patient was screened by Michel Santee, PT, DPT for appropriateness for an Inpatient Acute Rehab Consult.  At this time, we are recommending Inpatient Rehab consult. I will contact Trauma PA for order.   Michel Santee 08/08/2018, 10:27 AM  I can be reached at [phone]
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
## 2018-08-08 NOTE — Progress Notes (Signed)
Inpatient Rehab Admissions:  Inpatient Rehab Consult received.  I met with patient at the bedside for rehabilitation assessment and to discuss goals and expectations of an inpatient rehab admission.  She is open to CIR.  We did discuss self-pay status versus medicaid and she would like to be contacted by financial counselor to discuss medicaid eligibility.  I will contact her daughter to confirm possible assist and home set up.  Pt may be able to reach mod I w/c level goals if her home is w/c accessible.  Will continue to follow for timing of possible rehab admission pending family support.    Signed: Caitlin Warren, PT, DPT Admissions Coordinator [phone] 08/08/18  4:02 PM     
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
## 2018-08-08 NOTE — Progress Notes (Signed)
Orthopedic Tech Progress Note Patient Details:  Sandra Montoya 08-Oct-1960 [id number]  Ortho Devices Type of Ortho Device: CAM walker Ortho Device/Splint Location: right Ortho Device/Splint Interventions: Application   Post Interventions Patient Tolerated: Well Instructions Provided: Care of device   [place] Pink 08/08/2018, 11:07 AM
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
## 2018-08-09 ENCOUNTER — Encounter (HOSPITAL_COMMUNITY): Payer: Self-pay | Admitting: *Deleted

## 2018-08-09 ENCOUNTER — Inpatient Hospital Stay (HOSPITAL_COMMUNITY)
Admission: RE | Admit: 2018-08-09 | Discharge: 2018-08-19 | DRG: 560 | Disposition: A | Discharge status: Home Health | Payer: Self-pay | Source: Intra-hospital | Attending: Physical Medicine & Rehabilitation | Admitting: Physical Medicine & Rehabilitation

## 2018-08-09 ENCOUNTER — Other Ambulatory Visit: Payer: Self-pay

## 2018-08-09 ENCOUNTER — Encounter (HOSPITAL_COMMUNITY): Payer: Self-pay | Admitting: Physical Medicine and Rehabilitation

## 2018-08-09 DIAGNOSIS — K5900 Constipation, unspecified: Secondary | ICD-10-CM | POA: Diagnosis present

## 2018-08-09 DIAGNOSIS — Z825 Family history of asthma and other chronic lower respiratory diseases: Secondary | ICD-10-CM

## 2018-08-09 DIAGNOSIS — E559 Vitamin D deficiency, unspecified: Secondary | ICD-10-CM | POA: Diagnosis present

## 2018-08-09 DIAGNOSIS — S2249XA Multiple fractures of ribs, unspecified side, initial encounter for closed fracture: Secondary | ICD-10-CM | POA: Diagnosis present

## 2018-08-09 DIAGNOSIS — T1490XA Injury, unspecified, initial encounter: Secondary | ICD-10-CM

## 2018-08-09 DIAGNOSIS — F1010 Alcohol abuse, uncomplicated: Secondary | ICD-10-CM | POA: Diagnosis present

## 2018-08-09 DIAGNOSIS — S92001A Unspecified fracture of right calcaneus, initial encounter for closed fracture: Secondary | ICD-10-CM

## 2018-08-09 DIAGNOSIS — Z87820 Personal history of traumatic brain injury: Secondary | ICD-10-CM

## 2018-08-09 DIAGNOSIS — S2242XD Multiple fractures of ribs, left side, subsequent encounter for fracture with routine healing: Secondary | ICD-10-CM

## 2018-08-09 DIAGNOSIS — K219 Gastro-esophageal reflux disease without esophagitis: Secondary | ICD-10-CM | POA: Diagnosis present

## 2018-08-09 DIAGNOSIS — E876 Hypokalemia: Secondary | ICD-10-CM | POA: Diagnosis present

## 2018-08-09 DIAGNOSIS — S42301D Unspecified fracture of shaft of humerus, right arm, subsequent encounter for fracture with routine healing: Secondary | ICD-10-CM

## 2018-08-09 DIAGNOSIS — I10 Essential (primary) hypertension: Secondary | ICD-10-CM | POA: Diagnosis present

## 2018-08-09 DIAGNOSIS — F17210 Nicotine dependence, cigarettes, uncomplicated: Secondary | ICD-10-CM | POA: Diagnosis present

## 2018-08-09 DIAGNOSIS — Y92410 Unspecified street and highway as the place of occurrence of the external cause: Secondary | ICD-10-CM

## 2018-08-09 DIAGNOSIS — D72829 Elevated white blood cell count, unspecified: Secondary | ICD-10-CM | POA: Diagnosis present

## 2018-08-09 DIAGNOSIS — S92001D Unspecified fracture of right calcaneus, subsequent encounter for fracture with routine healing: Secondary | ICD-10-CM

## 2018-08-09 DIAGNOSIS — D62 Acute posthemorrhagic anemia: Secondary | ICD-10-CM | POA: Diagnosis present

## 2018-08-09 DIAGNOSIS — S3210XD Unspecified fracture of sacrum, subsequent encounter for fracture with routine healing: Secondary | ICD-10-CM

## 2018-08-09 DIAGNOSIS — S42302D Unspecified fracture of shaft of humerus, left arm, subsequent encounter for fracture with routine healing: Secondary | ICD-10-CM

## 2018-08-09 DIAGNOSIS — S32592D Other specified fracture of left pubis, subsequent encounter for fracture with routine healing: Principal | ICD-10-CM

## 2018-08-09 DIAGNOSIS — S32592B Other specified fracture of left pubis, initial encounter for open fracture: Secondary | ICD-10-CM

## 2018-08-09 DIAGNOSIS — R7989 Other specified abnormal findings of blood chemistry: Secondary | ICD-10-CM

## 2018-08-09 LAB — BASIC METABOLIC PANEL
Anion gap: 10 (ref 5–15)
BUN: 6 mg/dL (ref 6–20)
CO2: 27 mmol/L (ref 22–32)
Calcium: 8.2 mg/dL — ABNORMAL LOW (ref 8.9–10.3)
Chloride: 102 mmol/L (ref 98–111)
Creatinine, Ser: 0.7 mg/dL (ref 0.44–1.00)
GFR calc Af Amer: >60 mL/min (ref >60)
GFR calc non Af Amer: >60 mL/min (ref >60)
Glucose, Bld: 116 mg/dL — ABNORMAL HIGH (ref 70–99)
Potassium: 3.3 mmol/L — ABNORMAL LOW (ref 3.5–5.1)
Sodium: 139 mmol/L (ref 135–145)

## 2018-08-09 LAB — CBC
HCT: 27.5 % — ABNORMAL LOW (ref 36.0–46.0)
Hemoglobin: 8.9 g/dL — ABNORMAL LOW (ref 12.0–15.0)
MCH: 31.9 pg (ref 26.0–34.0)
MCHC: 32.4 g/dL (ref 30.0–36.0)
MCV: 98.6 fL (ref 80.0–100.0)
Platelets: 297 10*3/uL (ref 150–400)
RBC: 2.79 MIL/uL — ABNORMAL LOW (ref 3.87–5.11)
RDW: 13.6 % (ref 11.5–15.5)
WBC: 12.1 10*3/uL — ABNORMAL HIGH (ref 4.0–10.5)
nRBC: 0 % (ref 0.0–0.2)

## 2018-08-09 MED ORDER — GUAIFENESIN-DM 100-10 MG/5ML PO SYRP: 5.0000 mL | ORAL_SOLUTION | Freq: Four times a day (QID) | ORAL | Status: DC | PRN

## 2018-08-09 MED ORDER — FOLIC ACID 1 MG PO TABS
1.0000 mg | ORAL_TABLET | Freq: Every day | ORAL | Status: DC
  Administered 2018-08-10 – 2018-08-19 (×10): 1 mg via ORAL
  Filled 2018-08-09 (×10): qty 1

## 2018-08-09 MED ORDER — VITAMIN D 25 MCG (1000 UNIT) PO TABS
2000.0000 [IU] | ORAL_TABLET | Freq: Two times a day (BID) | ORAL | Status: DC
  Administered 2018-08-09 – 2018-08-19 (×20): 2000 [IU] via ORAL
  Filled 2018-08-09 (×20): qty 2

## 2018-08-09 MED ORDER — PROCHLORPERAZINE EDISYLATE 10 MG/2ML IJ SOLN: 5.0000 mg | Freq: Four times a day (QID) | INTRAMUSCULAR | Status: DC | PRN

## 2018-08-09 MED ORDER — TRAMADOL HCL 50 MG PO TABS
50.0000 mg | ORAL_TABLET | Freq: Four times a day (QID) | ORAL | Status: DC | PRN
  Administered 2018-08-16 – 2018-08-18 (×4): 50 mg via ORAL
  Filled 2018-08-09 (×4): qty 1

## 2018-08-09 MED ORDER — ACETAMINOPHEN 325 MG PO TABS
650.0000 mg | ORAL_TABLET | Freq: Four times a day (QID) | ORAL | Status: DC
  Administered 2018-08-09 – 2018-08-19 (×38): 650 mg via ORAL
  Filled 2018-08-09 (×39): qty 2

## 2018-08-09 MED ORDER — POTASSIUM CHLORIDE CRYS ER 20 MEQ PO TBCR
20.0000 meq | EXTENDED_RELEASE_TABLET | Freq: Every day | ORAL | Status: DC
  Administered 2018-08-12 – 2018-08-19 (×8): 20 meq via ORAL
  Filled 2018-08-09 (×8): qty 1

## 2018-08-09 MED ORDER — SENNOSIDES-DOCUSATE SODIUM 8.6-50 MG PO TABS
2.0000 | ORAL_TABLET | Freq: Every day | ORAL | Status: DC
  Administered 2018-08-09 – 2018-08-18 (×10): 2 via ORAL
  Filled 2018-08-09 (×10): qty 2

## 2018-08-09 MED ORDER — OXYCODONE HCL 5 MG PO TABS
5.0000 mg | ORAL_TABLET | ORAL | Status: DC | PRN
  Administered 2018-08-09 – 2018-08-18 (×15): 10 mg via ORAL
  Administered 2018-08-18: 5 mg via ORAL
  Administered 2018-08-19 (×2): 10 mg via ORAL
  Filled 2018-08-09 (×17): qty 2

## 2018-08-09 MED ORDER — PANTOPRAZOLE SODIUM 40 MG PO TBEC
40.0000 mg | DELAYED_RELEASE_TABLET | Freq: Every day | ORAL | Status: DC
  Administered 2018-08-10 – 2018-08-19 (×10): 40 mg via ORAL
  Filled 2018-08-09 (×10): qty 1

## 2018-08-09 MED ORDER — THIAMINE HCL 100 MG/ML IJ SOLN
100.0000 mg | Freq: Every day | INTRAMUSCULAR | Status: DC
  Administered 2018-08-17: 08:00:00 100 mg via INTRAVENOUS
  Filled 2018-08-09 (×10): qty 1

## 2018-08-09 MED ORDER — PROCHLORPERAZINE 25 MG RE SUPP
12.5000 mg | Freq: Four times a day (QID) | RECTAL | Status: DC | PRN
  Filled 2018-08-09: qty 1

## 2018-08-09 MED ORDER — ADULT MULTIVITAMIN W/MINERALS CH
1.0000 | ORAL_TABLET | Freq: Every day | ORAL | Status: DC
  Administered 2018-08-10 – 2018-08-19 (×10): 1 via ORAL
  Filled 2018-08-09 (×10): qty 1

## 2018-08-09 MED ORDER — DIPHENHYDRAMINE HCL 12.5 MG/5ML PO ELIX: 12.5000 mg | ORAL_SOLUTION | Freq: Four times a day (QID) | ORAL | Status: DC | PRN

## 2018-08-09 MED ORDER — METHOCARBAMOL 750 MG PO TABS
750.0000 mg | ORAL_TABLET | Freq: Three times a day (TID) | ORAL | Status: DC | PRN
  Administered 2018-08-10: 750 mg via ORAL
  Filled 2018-08-09: qty 1

## 2018-08-09 MED ORDER — POLYETHYLENE GLYCOL 3350 17 G PO PACK: 17.0000 g | PACK | Freq: Every day | ORAL | Status: DC | PRN

## 2018-08-09 MED ORDER — POTASSIUM CHLORIDE CRYS ER 20 MEQ PO TBCR
20.0000 meq | EXTENDED_RELEASE_TABLET | Freq: Two times a day (BID) | ORAL | Status: AC
  Administered 2018-08-09 – 2018-08-11 (×4): 20 meq via ORAL
  Filled 2018-08-09 (×4): qty 1

## 2018-08-09 MED ORDER — BISACODYL 10 MG RE SUPP: 10.0000 mg | Freq: Every day | RECTAL | Status: DC | PRN

## 2018-08-09 MED ORDER — VITAMIN B-1 100 MG PO TABS
100.0000 mg | ORAL_TABLET | Freq: Every day | ORAL | Status: DC
  Administered 2018-08-10 – 2018-08-19 (×9): 100 mg via ORAL
  Filled 2018-08-09 (×10): qty 1

## 2018-08-09 MED ORDER — ACETAMINOPHEN 325 MG PO TABS
325.0000 mg | ORAL_TABLET | ORAL | Status: DC | PRN
  Administered 2018-08-15 – 2018-08-16 (×2): 650 mg via ORAL
  Filled 2018-08-09 (×2): qty 2

## 2018-08-09 MED ORDER — TRAZODONE HCL 50 MG PO TABS
25.0000 mg | ORAL_TABLET | Freq: Every evening | ORAL | Status: DC | PRN
  Filled 2018-08-09: qty 1

## 2018-08-09 MED ORDER — ALUM & MAG HYDROXIDE-SIMETH 200-200-20 MG/5ML PO SUSP
30.0000 mL | ORAL | Status: DC | PRN
  Administered 2018-08-09: 30 mL via ORAL
  Filled 2018-08-09: qty 30

## 2018-08-09 MED ORDER — VITAMIN D 25 MCG (1000 UNIT) PO TABS
2000.0000 [IU] | ORAL_TABLET | Freq: Two times a day (BID) | ORAL | Status: DC
  Administered 2018-08-09: 2000 [IU] via ORAL
  Filled 2018-08-09: qty 2

## 2018-08-09 MED ORDER — ENOXAPARIN SODIUM 40 MG/0.4ML ~~LOC~~ SOLN
40.0000 mg | SUBCUTANEOUS | Status: DC
  Administered 2018-08-10 – 2018-08-19 (×10): 40 mg via SUBCUTANEOUS
  Filled 2018-08-09 (×10): qty 0.4

## 2018-08-09 MED ORDER — GABAPENTIN 600 MG PO TABS
300.0000 mg | ORAL_TABLET | Freq: Three times a day (TID) | ORAL | Status: DC
  Administered 2018-08-09 – 2018-08-11 (×6): 300 mg via ORAL
  Filled 2018-08-09 (×6): qty 1

## 2018-08-09 MED ORDER — FLEET ENEMA 7-19 GM/118ML RE ENEM: 1.0000 | ENEMA | Freq: Once | RECTAL | Status: DC | PRN

## 2018-08-09 MED ORDER — PROCHLORPERAZINE MALEATE 5 MG PO TABS: 5.0000 mg | ORAL_TABLET | Freq: Four times a day (QID) | ORAL | Status: DC | PRN
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
## 2018-08-09 NOTE — Progress Notes (Signed)
Patient ID: Sandra Montoya, female   DOB: Nov 23, 1960, 58 y.o.   MRN: [id number] Patient arrive from 4N23 via the hospital bed and RN. Patient oriented to room, nurse call system, rehab process, rehab schedule, fall prevention plan, rehab schedule, rehab safety plan, and health resource notebook with verbal understanding. Patient resting in bed with call bell at side, 3 side rails up.
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
## 2018-08-09 NOTE — Discharge Instructions (Signed)
Cast or Splint Care, Adult °Casts and splints are supports that are worn to protect broken bones and other injuries. A cast or splint may hold a bone still and in the correct position while it heals. Casts and splints may also help to ease pain, swelling, and muscle spasms. °How to care for your cast ° °· Do not stick anything inside the cast to scratch your skin. °· Check the skin around the cast every day. Tell your doctor about any concerns. °· You may put lotion on dry skin around the edges of the cast. Do not put lotion on the skin under the cast. °· Keep the cast clean. °· If the cast is not waterproof: °? Do not let it get wet. °? Cover it with a watertight covering when you take a bath or a shower. °How to care for your splint ° °· Wear it as told by your doctor. Take it off only as told by your doctor. °· Loosen the splint if your fingers or toes tingle, get numb, or turn cold and blue. °· Keep the splint clean. °· If the splint is not waterproof: °? Do not let it get wet. °? Cover it with a watertight covering when you take a bath or a shower. °Follow these instructions at home: °Bathing °· Do not take baths or swim until your doctor says it is okay. Ask your doctor if you can take showers. You may only be allowed to take sponge baths for bathing. °· If your cast or splint is not waterproof, cover it with a watertight covering when you take a bath or shower. °Managing pain, stiffness, and swelling °· Move your fingers or toes often to avoid stiffness and to lessen swelling. °· Raise (elevate) the injured area above the level of your heart while sitting or lying down. °Safety °· Do not use the injured limb to support your body weight until your doctor says that it is okay. °· Use crutches or other assistive devices as told by your doctor. °General instructions °· Do not put pressure on any part of the cast or splint until it is fully hardened. This may take many hours. °· Return to your normal activities as  told by your doctor. Ask your doctor what activities are safe for you. °· Keep all follow-up visits as told by your doctor. This is important. °Contact a doctor if: °· Your cast or splint gets damaged. °· The skin around the cast gets red or raw. °· The skin under the cast is very itchy or painful. °· Your cast or splint feels very uncomfortable. °· Your cast or splint is too tight or too loose. °· Your cast becomes wet or it starts to have a soft spot or area. °· You get an object stuck under your cast. °Get help right away if: °· Your pain gets worse. °· The injured area tingles, gets numb, or turns blue and cold. °· The part of your body above or below the cast is swollen and it turns a different color (is discolored). °· You cannot feel or move your fingers or toes. °· There is fluid leaking through the cast. °· You have very bad pain or pressure under the cast. °· You have trouble breathing. °· You have shortness of breath. °· You have chest pain. °This information is not intended to replace advice given to you by your health care provider. Make sure you discuss any questions you have with your health care provider. °Document Released:   05/06/2010 Document Revised: 04/26/2018 Document Reviewed: 12/26/2015 Elsevier Patient Education  [place].   How To Use a Sling A sling is a type of hanging bandage that is worn around the neck to protect an injured arm, shoulder, or other body part. A sling may be needed to prevent movement of (to immobilize) the injured body part while it heals. Keeping the injured body part still can lessen pain and speed up healing. A health care provider may recommend using a sling:  To treat a broken arm or collarbone.  To treat a shoulder injury.  After surgery. What are the risks? In general, wearing a sling helps with safe healing. However, in some cases, wearing a sling the wrong way can:  Make the injury worse.  Cause stiffness or numbness.  Affect blood  flow (circulation) in the arm and hand. This can cause tingling or numbness in the fingers or hands. How to use a sling Follow instructions from your health care provider about how and when to wear your sling. Your health care provider will show you or tell you:  How to put on the sling.  How to adjust the sling.  When and how often to wear the sling.  How to remove the sling. The way that you need to use a sling depends on your injury. Unless your health care provider gives you different instructions, you should:  Wear the sling so that your elbow bends to the shape of a capital letter "L" (90 degrees, or a right angle).  Make sure the sling supports your wrist and your hand.  Adjust the sling if your fingers or hand start to tingle or feel numb. Follow these instructions at home:  Try to avoid moving your arm.  Do not twist, lift, or move your arm in a way that could make your injury worse.  Do not lean on your arm while wearing a sling.  Do not lift anything with the hand or arm that is in the sling. Contact a health care provider if:  You have bruising, swelling, or pain that gets worse.  You have pain that does not get better with medicine.  You have a fever.  Your sling is not supporting your arm properly.  Your sling gets damaged. Get help right away if you:  Have numbness or tingling in your fingers.  Notice that your fingers turn blue or feel cold to the touch.  Cannot control bleeding from your injury.  Have shortness of breath. Summary  A sling is a type of hanging bandage that is worn around the neck to protect an injured arm, shoulder, or other body part. A sling may be needed to prevent movement of (to immobilize) the injured body part while it heals.  The way that you use a sling depends on your injury. Carefully follow instructions from your health care provider.  A good general rule is to wear the sling so that your arms bends 90 degrees (at a  right angle) at the elbow. That is like the shape of a capital letter "L."  Make sure you know which problems should cause you to contact your health care provider or get help right away. This information is not intended to replace advice given to you by your health care provider. Make sure you discuss any questions you have with your health care provider. Document Released: 08/19/2003 Document Revised: 12/17/2016 Document Reviewed: 11/25/2016 Elsevier Patient Education  2020 [employer].

## 2018-08-09 NOTE — Plan of Care (Signed)
Pt progressing well and ready for discharge to [facility].

## 2018-08-10 ENCOUNTER — Inpatient Hospital Stay (HOSPITAL_COMMUNITY): Payer: Self-pay

## 2018-08-10 ENCOUNTER — Inpatient Hospital Stay (HOSPITAL_COMMUNITY): Payer: Self-pay | Admitting: Physical Therapy

## 2018-08-10 ENCOUNTER — Encounter (HOSPITAL_COMMUNITY): Payer: Self-pay | Admitting: Student

## 2018-08-10 DIAGNOSIS — S062X2S Diffuse traumatic brain injury with loss of consciousness of 31 minutes to 59 minutes, sequela: Secondary | ICD-10-CM

## 2018-08-10 DIAGNOSIS — S92001S Unspecified fracture of right calcaneus, sequela: Secondary | ICD-10-CM

## 2018-08-10 DIAGNOSIS — S32592S Other specified fracture of left pubis, sequela: Secondary | ICD-10-CM

## 2018-08-10 DIAGNOSIS — D62 Acute posthemorrhagic anemia: Secondary | ICD-10-CM

## 2018-08-10 LAB — CBC WITH DIFFERENTIAL/PLATELET
Abs Immature Granulocytes: 0.06 10*3/uL (ref 0.00–0.07)
Basophils Absolute: 0 10*3/uL (ref 0.0–0.1)
Basophils Relative: 0 %
Eosinophils Absolute: 0.1 10*3/uL (ref 0.0–0.5)
Eosinophils Relative: 1 %
HCT: 26.5 % — ABNORMAL LOW (ref 36.0–46.0)
Hemoglobin: 8.6 g/dL — ABNORMAL LOW (ref 12.0–15.0)
Immature Granulocytes: 1 %
Lymphocytes Relative: 20 %
Lymphs Abs: 2.2 10*3/uL (ref 0.7–4.0)
MCH: 32.2 pg (ref 26.0–34.0)
MCHC: 32.5 g/dL (ref 30.0–36.0)
MCV: 99.3 fL (ref 80.0–100.0)
Monocytes Absolute: 0.9 10*3/uL (ref 0.1–1.0)
Monocytes Relative: 8 %
Neutro Abs: 7.9 10*3/uL — ABNORMAL HIGH (ref 1.7–7.7)
Neutrophils Relative %: 70 %
Platelets: 318 10*3/uL (ref 150–400)
RBC: 2.67 MIL/uL — ABNORMAL LOW (ref 3.87–5.11)
RDW: 13.1 % (ref 11.5–15.5)
WBC: 11.2 10*3/uL — ABNORMAL HIGH (ref 4.0–10.5)
nRBC: 0.3 % — ABNORMAL HIGH (ref 0.0–0.2)

## 2018-08-10 LAB — COMPREHENSIVE METABOLIC PANEL
ALT: 27 U/L (ref 0–44)
AST: 34 U/L (ref 15–41)
Albumin: 3 g/dL — ABNORMAL LOW (ref 3.5–5.0)
Alkaline Phosphatase: 63 U/L (ref 38–126)
Anion gap: 10 (ref 5–15)
BUN: 6 mg/dL (ref 6–20)
CO2: 25 mmol/L (ref 22–32)
Calcium: 8.9 mg/dL (ref 8.9–10.3)
Chloride: 100 mmol/L (ref 98–111)
Creatinine, Ser: 0.81 mg/dL (ref 0.44–1.00)
GFR calc Af Amer: >60 mL/min (ref >60)
GFR calc non Af Amer: >60 mL/min (ref >60)
Glucose, Bld: 120 mg/dL — ABNORMAL HIGH (ref 70–99)
Potassium: 4.1 mmol/L (ref 3.5–5.1)
Sodium: 135 mmol/L (ref 135–145)
Total Bilirubin: 0.8 mg/dL (ref 0.3–1.2)
Total Protein: 6.2 g/dL — ABNORMAL LOW (ref 6.5–8.1)

## 2018-08-10 NOTE — Discharge Instructions (Addendum)
Inpatient Rehab Discharge Instructions  Sandra Montoya Discharge date and time:  08/19/18   Activities/Precautions/ Functional Status: Activity: no lifting, driving, or strenuous exercise for till cleared by MD Diet: regular diet Wound Care: Keep area clean and dry. Contact Dr. Doreatha Martin if you develop any problems with your incision/wound--redness, swelling, increase in pain, drainage or if you develop fever or chills.    Functional status:  ___ No restrictions     ___ Walk up steps independently _X__ 24/7 supervision/assistance   ___ Walk up steps with assistance ___ Intermittent supervision/assistance  ___ Bathe/dress independently ___ Walk with walker     ___ Bathe/dress with assistance ___ Walk Independently    ___ Shower independently ___ Walk with assistance    ___ Shower with assistance ___ No alcohol     ___ Return to work/school ________  Special Instructions: 1. NO weight on right leg till cleared by Dr. Doreatha Martin.  2. Wear boot on right leg at all times.      COMMUNITY REFERRALS UPON DISCHARGE:    Home Health:   PT, RN, OT  Agency:[facility]   Phone: [phone]  Medical Equipment/Items Ordered:WHEELCHAIR, Vassie Moselle, 3 IN 1 COMMODE AND TUB BENCH  Agency/Supplier:[facility]   [phone]  OTHER: MATCH GIVEN FOR ASSISTANCE WITH MEDICATIONS    My questions have been answered and I understand these instructions. I will adhere to these goals and the provided educational materials after my discharge from the hospital.  Patient/Caregiver Signature _______________________________ Date __________  Clinician Signature _______________________________________ Date __________  Please bring this form and your medication list with you to all your follow-up doctor's appointments.        [facility] Discharge Instructions   General Discharge Instructions  WEIGHT BEARING STATUS: Weightbearing as tolerated left arm, weightbearing as tolerated left  leg, non-weightbearing right lower extremity  RANGE OF MOTION/ACTIVITY: Unrestricted range of motion of left shoulder and elbow. Okay to start gentle range of motion of right ankle  Wound Care: Incisions can be left open to air if there is no drainage. Do not remove steri-strips, they will fall off on their own. If incision continues to have drainage, follow wound care instructions below. Okay to shower if no drainage from incisions.  DVT/PE prophylaxis: Lovenox daily  Diet: as you were eating previously.  Can use over the counter stool softeners and bowel preparations, such as Miralax, to help with bowel movements.  Narcotics can be constipating.  Be sure to drink plenty of fluids  PAIN MEDICATION USE AND EXPECTATIONS  You have likely been given narcotic medications to help control your pain.  After a traumatic event that results in an fracture (broken bone) with or without surgery, it is ok to use narcotic pain medications to help control one's pain.  We understand that everyone responds to pain differently and each individual patient will be evaluated on a regular basis for the continued need for narcotic medications. Ideally, narcotic medication use should last no more than 6-8 weeks (coinciding with fracture healing).   As a patient it is your responsibility as well to monitor narcotic medication use and report the amount and frequency you use these medications when you come to your office visit.   We would also advise that if you are using narcotic medications, you should take a dose prior to therapy to maximize you participation.  IF YOU ARE ON NARCOTIC MEDICATIONS IT IS NOT PERMISSIBLE TO OPERATE A MOTOR VEHICLE (MOTORCYCLE/CAR/TRUCK/MOPED) OR HEAVY MACHINERY DO NOT MIX NARCOTICS  WITH OTHER CNS (CENTRAL NERVOUS SYSTEM) DEPRESSANTS SUCH AS ALCOHOL   STOP SMOKING OR USING NICOTINE PRODUCTS!!!!  As discussed nicotine severely impairs your body's ability to heal surgical and traumatic wounds  but also impairs bone healing.  Wounds and bone heal by forming microscopic blood vessels (angiogenesis) and nicotine is a vasoconstrictor (essentially, shrinks blood vessels).  Therefore, if vasoconstriction occurs to these microscopic blood vessels they essentially disappear and are unable to deliver necessary nutrients to the healing tissue.  This is one modifiable factor that you can do to dramatically increase your chances of healing your injury.    (This means no smoking, no nicotine gum, patches, etc)  DO NOT USE NONSTEROIDAL ANTI-INFLAMMATORY DRUGS (NSAID'S)  Using products such as Advil (ibuprofen), Aleve (naproxen), Motrin (ibuprofen) for additional pain control during fracture healing can delay and/or prevent the healing response.  If you would like to take over the counter (OTC) medication, Tylenol (acetaminophen) is ok.  However, some narcotic medications that are given for pain control contain acetaminophen as well. Therefore, you should not exceed more than 4000 mg of tylenol in a day if you do not have liver disease.  Also note that there are may OTC medicines, such as cold medicines and allergy medicines that my contain tylenol as well.  If you have any questions about medications and/or interactions please ask your doctor/PA or your pharmacist.      ICE AND ELEVATE INJURED/OPERATIVE EXTREMITY  Using ice and elevating the injured extremity above your heart can help with swelling and pain control.  Icing in a pulsatile fashion, such as 20 minutes on and 20 minutes off, can be followed.    Do not place ice directly on skin. Make sure there is a barrier between to skin and the ice pack.    Using frozen items such as frozen peas works well as the conform nicely to the are that needs to be iced.  USE AN ACE WRAP OR TED HOSE FOR SWELLING CONTROL  In addition to icing and elevation, Ace wraps or TED hose are used to help limit and resolve swelling.  It is recommended to use Ace wraps or TED  hose until you are informed to stop.    When using Ace Wraps start the wrapping distally (farthest away from the body) and wrap proximally (closer to the body)   Example: If you had surgery on your leg or thing and you do not have a splint on, start the ace wrap at the toes and work your way up to the thigh        If you had surgery on your upper extremity and do not have a splint on, start the ace wrap at your fingers and work your way up to the upper arm   IF YOU ARE IN A CAM BOOT (BLACK BOOT)  You may remove boot periodically. Perform daily dressing changes as noted below.  Wash the liner of the boot regularly and wear a sock when wearing the boot. It is recommended that you sleep in the boot until told otherwise   CALL THE OFFICE WITH ANY QUESTIONS OR CONCERNS: [phone]   VISIT OUR WEBSITE FOR ADDITIONAL INFORMATION: [email or web address]   Discharge Wound Care Instructions  Do NOT apply any ointments, solutions or lotions to pin sites or surgical wounds.  These prevent needed drainage and even though solutions like hydrogen peroxide kill bacteria, they also damage cells lining the pin sites that help fight infection.  Applying lotions or ointments  can keep the wounds moist and can cause them to breakdown and open up as well. This can increase the risk for infection. When in doubt call the office.  Surgical incisions should be dressed daily.  If any drainage is noted, use one layer of adaptic, then gauze, Kerlix, and an ace wrap.  Once the incision is completely dry and without drainage, it may be left open to air out.  Showering may begin 36-48 hours later.  Cleaning gently with soap and water.  Traumatic wounds should be dressed daily as well.    One layer of adaptic, gauze, Kerlix, then ace wrap.  The adaptic can be discontinued once the draining has ceased    If you have a wet to dry dressing: wet the gauze with saline the squeeze as much saline out so the gauze is moist (not  soaking wet), place moistened gauze over wound, then place a dry gauze over the moist one, followed by Kerlix wrap, then ace wrap.

## 2018-08-10 NOTE — OR Nursing (Signed)
OR Record amended on 08/10/18 to include positioning for Humerus and Buttock procedure, and equiment numbers.
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
## 2018-08-10 NOTE — Progress Notes (Signed)
Inpatient Rehabilitation  Patient information reviewed and entered into eRehab system by Dafna Romo M. Rubylee Zamarripa, M.A., CCC/SLP, PPS Coordinator.  Information including medical coding, functional ability and quality indicators will be reviewed and updated through discharge.    
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
## 2018-08-10 NOTE — Progress Notes (Signed)
Physical Therapy Session Note  Patient Details  Name: Margi Edmundson MRN: [id number] Date of Birth: 1960-03-15  Today's Date: 08/10/2018 PT Individual Time: 3846-6599 PT Individual Time Calculation (min): 72 min   Short Term Goals: Week 1:  PT Short Term Goal 1 (Week 1): Pt will perform supine<>sit with CGA PT Short Term Goal 2 (Week 1): Pt will perform bed<>chair transfers with CGA PT Short Term Goal 3 (Week 1): Pt will perform sit<>stand with min assist using LRAD PT Short Term Goal 4 (Week 1): Pt will ambulate 92ft using LRAD with mod assist  Skilled Therapeutic Interventions/Progress Updates:   Pt in w/c and agreeable to therapy, pain 7/10 in LUE (premedicated). Pt self-propelled w/c to/from therapy gym w/ LLE and RUE, supervision level assist.   Sit<>stands from w/c w/ min assist, ambulated 15' w/ PFRW and min assist for balance. Pt doing adequate job of keeping RLE NWB, most pain coming w/ LUE WB on PFRW, pain 8/10.   Worked on RLE strengthening in seated including LAQs 2x10, knee marches 2x10, and heel slides w/ orange TB resistance 3x10.  LUE gentle AROM and strengthening performed in supine, including elbow flexion AROM throughout tolerable range 3x10, elbow extension stretch 15 sec x3, isometric shoulder abduction 3x10, isometric shoulder ER 2x10, isometric shoulder IR 2x10, and isometric elbow extension @ 90 deg flexion 3x10. LLE functional strengthening on kinetron w/ therapist providing resistance on other pedal, 3x10 reps.   Returned to room via w/c and performed toilet transfer w/ mod assist via squat pivot and verbal/visual cues for technique. Ended session in supine and all needs in reach. Ice applied to LUE for pain/swelling relief.   Therapy Documentation Precautions:  Precautions Precautions: Fall, Other (comment) Required Braces or Orthoses: Sling, Other Brace Other Brace: CAM boot RLE and sling L UE Restrictions Weight Bearing Restrictions: Yes LUE Weight Bearing:  Weight bearing as tolerated RLE Weight Bearing: Non weight bearing LLE Weight Bearing: Weight bearing as tolerated  Therapy/Group: Individual Therapy  Alexsis Branscom Clent Demark 08/10/2018, 10:57 AM
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
## 2018-08-11 ENCOUNTER — Inpatient Hospital Stay (HOSPITAL_COMMUNITY): Payer: Self-pay | Admitting: Occupational Therapy

## 2018-08-11 ENCOUNTER — Inpatient Hospital Stay (HOSPITAL_COMMUNITY): Payer: Self-pay

## 2018-08-11 ENCOUNTER — Inpatient Hospital Stay (HOSPITAL_COMMUNITY): Payer: Self-pay | Admitting: Physical Therapy

## 2018-08-11 MED ORDER — GABAPENTIN 300 MG PO CAPS
300.0000 mg | ORAL_CAPSULE | Freq: Three times a day (TID) | ORAL | Status: DC
  Administered 2018-08-11 – 2018-08-19 (×24): 300 mg via ORAL
  Filled 2018-08-11 (×23): qty 1

## 2018-08-11 MED ORDER — POLYSACCHARIDE IRON COMPLEX 150 MG PO CAPS
150.0000 mg | ORAL_CAPSULE | Freq: Every day | ORAL | Status: DC
  Administered 2018-08-11 – 2018-08-19 (×9): 150 mg via ORAL
  Filled 2018-08-11 (×9): qty 1

## 2018-08-11 MED ORDER — PRO-STAT SUGAR FREE PO LIQD
30.0000 mL | Freq: Two times a day (BID) | ORAL | Status: DC
  Administered 2018-08-11 – 2018-08-19 (×17): 30 mL via ORAL
  Filled 2018-08-11 (×17): qty 30

## 2018-08-11 MED ORDER — ENOXAPARIN (LOVENOX) PATIENT EDUCATION KIT
PACK | Freq: Once | Status: AC
  Administered 2018-08-11: 17:00:00
  Filled 2018-08-11: qty 1

## 2018-08-11 NOTE — Progress Notes (Signed)
Occupational Therapy Session Note  Patient Details  Name: Sandra Montoya MRN: [id number] Date of Birth: 25-May-1960  Today's Date: 08/11/2018 OT Individual Time: 2585-2778 OT Individual Time Calculation (min): 73 min    Short Term Goals: Week 1:  OT Short Term Goal 1 (Week 1): Pt will complete toilet/BSc transfer wiht S OT Short Term Goal 2 (Week 1): Pt will recall hemi dressing technqiues wiht no VC OT Short Term Goal 3 (Week 1): Pt will advance pants past hips with CGA OT Short Term Goal 4 (Week 1): Pt will don cam boot wiht no VC  Skilled Therapeutic Interventions/Progress Updates:    1;1. Pt received in bed aftereating breakfast and having pain medication so pain was "good." pt elects to bathe during afternoon session at end of day. Pt dresses UB at EOB with set up (recalling hemi UB dressing without cues) and LB with CGA and mod VC for maintianing WB precautions/not standing on 1LE without UE support when advancing pants past hips. Pt completes oral care, face washing and hair brushing at the sink with set up. Pt completes w/c mobility with supervision throughout session to/from all tx destinations. Pt completes standing wii bowling with mod VC for maintaining NWB and CGA for standing balance with RUE swing. Pt completes wii tennis game in seated using LUE in allotted ROM to play for gentle AROM. Educated pt on energy conservation w/c mobility in home and adaptations for RW once more mobile in home. Exited session with pt seated in bed, exit alarm on and call light in reach  Therapy Documentation Precautions:  Precautions Precautions: Fall, Other (comment) Precaution Comments: CIWA-score has been 0 since admission Required Braces or Orthoses: Sling, Other Brace Other Brace: CAM boot RLE and sling L UE Restrictions Weight Bearing Restrictions: Yes LUE Weight Bearing: Weight bearing as tolerated RLE Weight Bearing: Non weight bearing LLE Weight Bearing: Weight bearing as tolerated Other  Position/Activity Restrictions: R UE allowed AROm to 90 degrees and AROM Elbow wrist and hand General:   Vital Signs:   Pain: Pain Assessment Pain Scale: 0-10 Pain Score: 8  Pain Type: Acute pain Pain Location: Shoulder Pain Orientation: Left Pain Descriptors / Indicators: Aching;Sharp Pain Frequency: Intermittent Pain Onset: On-going Patients Stated Pain Goal: 3 Pain Intervention(s): Medication (See eMAR) Multiple Pain Sites: Yes ADL:   Vision   Perception    Praxis   Exercises:   Other Treatments:     Therapy/Group: Individual Therapy  Tonny Branch 08/11/2018, 7:19 AM

## 2018-08-11 NOTE — Progress Notes (Signed)
Occupational Therapy Session Note  Patient Details  Name: Sandra Montoya MRN: [id number] Date of Birth: 1960/08/08  Today's Date: 08/11/2018 OT Individual Time: 1694-5038 OT Individual Time Calculation (min): 72 min    Short Term Goals: Week 1:  OT Short Term Goal 1 (Week 1): Pt will complete toilet/BSc transfer wiht S OT Short Term Goal 2 (Week 1): Pt will recall hemi dressing technqiues wiht no VC OT Short Term Goal 3 (Week 1): Pt will advance pants past hips with CGA OT Short Term Goal 4 (Week 1): Pt will don cam boot wiht no VC  Skilled Therapeutic Interventions/Progress Updates:    1:1. Pt received in bed reporitn 8/10 pain but unable to receive medication. Rest provided PRN for pain. Pt completes stand pivot transfer with MIN A using RW and CAM boot EOB<>w/c<>shower chair in bathroom. Pt bathes seated with occlusives applied to shoulder incision as well as R foot. Pt completes bathing seated in w/c using lateral leans to wash vuttocks with VC. Pt dresses UB with Supervision for VC for R hand first and MIN A for standing balance during clothing mangament. Pt completes toileting with MIN A overall. Pt completes w/c mobility with supervision using hemi technqiue. Pt completes AROM/stretching of LUE to improve shoulder functional ROM during ABADL via towel slides up hill and bringing arm following hula hoop. Exited session with pt seated in bed with call light in reach exit alarm on and all needs met  Therapy Documentation Precautions:  Precautions Precautions: Fall Precaution Comments: LUE sling for comfort only, LUE ROM as tolerated Required Braces or Orthoses: Sling, Other Brace Other Brace: CAM boot RLE Restrictions Weight Bearing Restrictions: Yes LUE Weight Bearing: Weight bearing as tolerated RLE Weight Bearing: Non weight bearing LLE Weight Bearing: Weight bearing as tolerated Other Position/Activity Restrictions: R UE allowed AROm to 90 degrees and AROM Elbow wrist and  hand General:   Vital Signs: Therapy Vitals Temp: 98.3 F (36.8 C) Pulse Rate: 87 Resp: 16 BP: 116/66 Patient Position (if appropriate): Lying Oxygen Therapy SpO2: 97 % O2 Device: Room Air Pain: Pain Assessment Pain Scale: 0-10 Pain Score: 3  Pain Type: Acute pain Pain Location: Shoulder Pain Orientation: Left Pain Descriptors / Indicators: Aching Pain Frequency: Intermittent Pain Onset: On-going Patients Stated Pain Goal: 4 Pain Intervention(s): Medication (See eMAR) ADL:   Vision   Perception    Praxis   Exercises:   Other Treatments:     Therapy/Group: Individual Therapy  Tonny Branch 08/11/2018, 3:10 PM

## 2018-08-11 NOTE — Progress Notes (Signed)
Occupational Therapy Session Note  Patient Details  Name: Sandra Montoya MRN: [id number] Date of Birth: 10-02-1960  Today's Date: 08/11/2018 OT Individual Time: 7425-9563 OT Individual Time Calculation (min): 41 min   Short Term Goals: Week 1:  OT Short Term Goal 1 (Week 1): Pt will complete toilet/BSc transfer wiht S OT Short Term Goal 2 (Week 1): Pt will recall hemi dressing technqiues wiht no VC OT Short Term Goal 3 (Week 1): Pt will advance pants past hips with CGA OT Short Term Goal 4 (Week 1): Pt will don cam boot wiht no VC  Skilled Therapeutic Interventions/Progress Updates:    Pt greeted in bed, requesting to use the restroom. Supine<sit completed with supervision and HOB elevated. While sitting EOB pt was able to don her Rt CAM boot. Min vcs for problem solving when 1 strap detached. Able to state her NWB precautions prior to transfer. Stand pivot<w/c completed to w/c and then to standard toilet with Min A and PFRW. Vcs for keeping R LE extended during power ups and when lowering onto transfer surfaces. Vcs also for pushing down through UEs for higher hops. Pt had her boot on the floor during transfers and dynamic stands, but she verbalized that she was still maintaining NWB precautions at this time. Steady assist for clothing mgt with pt having continent bladder void. Mod A for power up from low toilet. Once back in w/c, pt completed handwashing w/c level at the sink. Escorted her via w/c to DME supply closet. Provided pt with new fitted w/c including ELR for R LE edema mgt. She self propelled from [place] station back to room to work on L UE strengthening/AROM. Pt reported no L UE pain during self propulsion. When she returned to room, OT provided her with ice packs for R LE and Lt shoulder per request. Left pt with all needs within reach and chair alarm set.   Therapy Documentation Precautions:  Precautions Precautions: Fall Precaution Comments: LUE sling for comfort only, LUE ROM  as tolerated Required Braces or Orthoses: Sling, Other Brace Other Brace: CAM boot RLE Restrictions Weight Bearing Restrictions: Yes LUE Weight Bearing: Weight bearing as tolerated RLE Weight Bearing: Non weight bearing LLE Weight Bearing: Weight bearing as tolerated Other Position/Activity Restrictions: R UE allowed AROm to 90 degrees and AROM Elbow wrist and hand Pain: in R LE. Cryotherapy provided to address   ADL:       Therapy/Group: Individual Therapy  Racine Erby A Lanie Schelling 08/11/2018, 12:38 PM

## 2018-08-11 NOTE — Progress Notes (Addendum)
Physical Therapy Session Note  Patient Details  Name: Sandra Montoya MRN: [id number] Date of Birth: 1960-08-15  Today's Date: 08/11/2018 PT Individual Time: 6629-4765 PT Individual Time Calculation (min): 41 min   Short Term Goals: Week 1:  PT Short Term Goal 1 (Week 1): Pt will perform supine<>sit with CGA PT Short Term Goal 2 (Week 1): Pt will perform bed<>chair transfers with CGA PT Short Term Goal 3 (Week 1): Pt will perform sit<>stand with min assist using LRAD PT Short Term Goal 4 (Week 1): Pt will ambulate 62ft using LRAD with mod assist  Skilled Therapeutic Interventions/Progress Updates:  Pt received in w/c & agreeable to tx, reporting unrated LUE pain & pain meds requested. Pt reports need to use restroom so transported in/out of bathroom via w/c dependent assist. Reviewed NWB RLE and pt transfers sit<>stand with armrests & grab bar with min assist, completing stand pivot w/c<>elevated seat over toilet with min assist. Pt with continent void on toilet, managing clothing with cuing and min assist for balance. Pt performs hand hygiene sitting at sink with set up assist. Pt propels w/c room<>dayroom with LLE & RUE, beginning to use LUE when returning to room, with supervision and cuing for technique. Reviewed precautions with pt as noted below and removed platform portion of RW as PA cleared pt to use RW. Pt transfers sit<>stand with min assist and cuing for NWB RLE and ambulates 5 ft x 2 with min assist with max cuing & demonstration for gait pattern with poor return demo & pt beginning to weight bear through RLE during 2nd gait trial so pt assisted to sitting in w/c. Therapist educates pt on using BUE to push up to allow pt to swing LLE forward but pt continues to hop in LLE with very poor foot clearance. Provided pt with 3# weighted bar & instructed pt in performing BUE bicep curls and chest press (therapist provides support at L elbow during 2nd set) with focus on LUE strengthening & ROM (pt  unable to fully extend LUE elbow and flexing shoulder to 90 degrees). At end of session pt left in w/c with chair alarm donned & call bell in reach.   Confirmed precautions with PA (Pam) who reports pt can perform LUE ROM as tolerated, sling for comfort only, no restrictions to RUE. LLE WBAT and RLE NWB with CAM boot.  Therapy Documentation Precautions:  Precautions Precautions: Fall Precaution Comments: LUE sling for comfort only, LUE ROM as tolerated Required Braces or Orthoses: Other Brace Other Brace: CAM boot RLE Restrictions Weight Bearing Restrictions: Yes LUE Weight Bearing: Weight bearing as tolerated RLE Weight Bearing: Non weight bearing LLE Weight Bearing: Weight bearing as tolerated    Therapy/Group: Individual Therapy  Waunita Schooner 08/11/2018, 12:05 PM

## 2018-08-11 NOTE — IPOC Note (Signed)
Overall Plan of Care [facility](IPOC) Patient Details Name: Sandra DialsBetty Montoya MRN: [id number] DOB: 06/08/1960  Admitting Diagnosis: <principal problem not specified>  Hospital Problems: Active Problems:   Trauma     Functional Problem List: Nursing Pain, Perception, Safety, Bowel, Edema, Endurance, Skin Integrity, Medication Management, Bladder  PT Balance, Safety, Sensory, Endurance, Motor, Pain, Edema  OT Balance, Endurance, Motor, Pain, Safety, Skin Integrity, Sensory  SLP    TR         Basic ADL's: OT Grooming, Bathing, Dressing, Toileting     Advanced  ADL's: OT Simple Meal Preparation     Transfers: PT Bed Mobility, Bed to Chair, Car, Occupational psychologisturniture  OT Toilet, Research scientist (life sciences)Tub/Shower     Locomotion: PT Ambulation, Psychologist, prison and probation servicesWheelchair Mobility, Stairs     Additional Impairments: OT None  SLP        TR      Anticipated Outcomes Item Anticipated Outcome  Self Feeding no goal  Swallowing      Basic self-care  MOD I  Toileting  MOD I toilet; S shower   Bathroom Transfers MOD I toilet; S shower  Bowel/Bladder  Mod I with bowel and bladder  Transfers  mod-I bed<>chair transfers  Locomotion  CGA short distance gait using LRAD  Communication     Cognition     Pain  <4 on a 0-10 pain scale  Safety/Judgment  min assist with weight bearing restrictions   Therapy Plan: PT Intensity: Minimum of 1-2 x/day ,45 to 90 minutes PT Frequency: 5 out of 7 days PT Duration Estimated Length of Stay: 2 weeks OT Intensity: Minimum of 1-2 x/day, 45 to 90 minutes OT Frequency: 5 out of 7 days OT Duration/Estimated Length of Stay: 10-14     Due to the current state of emergency, patients may not be receiving their 3-hours of Medicare-mandated therapy.   Team Interventions: Nursing Interventions Patient/Family Education, Pain Management, Bladder Management, Medication Management, Discharge Planning, Bowel Management, Skin Care/Wound Management, Psychosocial Support  PT interventions Ambulation/gait  training, Community reintegration, DME/adaptive equipment instruction, Neuromuscular re-education, Psychosocial support, Stair training, UE/LE Strength taining/ROM, Wheelchair propulsion/positioning, Warden/rangerBalance/vestibular training, Discharge planning, Functional electrical stimulation, Pain management, Skin care/wound management, Therapeutic Activities, UE/LE Coordination activities, Cognitive remediation/compensation, Disease management/prevention, Functional mobility training, Patient/family education, Splinting/orthotics, Therapeutic Exercise, Visual/perceptual remediation/compensation  OT Interventions Discharge planning, Balance/vestibular training, Pain management, Self Care/advanced ADL retraining, Therapeutic Activities, UE/LE Coordination activities, Disease mangement/prevention, Functional mobility training, Patient/family education, Skin care/wound managment, Therapeutic Exercise, Visual/perceptual remediation/compensation, FirefighterCommunity reintegration, Fish farm managerDME/adaptive equipment instruction, Neuromuscular re-education, Psychosocial support, Splinting/orthotics, UE/LE Strength taining/ROM, Wheelchair propulsion/positioning  SLP Interventions    TR Interventions    SW/CM Interventions Psychosocial Support, Discharge Planning, Patient/Family Education   Barriers to Discharge MD  Medical stability  Nursing Inaccessible home environment, Decreased caregiver support, Home environment access/layout, Wound Care, Incontinence, Weight bearing restrictions, Lack of/limited family support, Medication compliance    PT Decreased caregiver support, Lack of/limited family support, Weight bearing restrictions    OT Decreased caregiver support, Lack of/limited family support unsure if family able to provide 24/7 at d/t  SLP      SW       Team Discharge Planning: Destination: PT-Home ,OT- Home , SLP-  Projected Follow-up: PT-Home health PT, 24 hour supervision/assistance, OT-  Home health OT, SLP-  Projected  Equipment Needs: PT-To be determined, Wheelchair cushion (measurements), Wheelchair (measurements), Rolling walker with 5" wheels, OT- Tub/shower bench, 3 in 1 bedside comode, SLP-  Equipment Details: PT-anticipated DME above but TBD towards D/C, OT-  Patient/family involved  in discharge planning: PT- Patient,  OT-Patient, SLP-   MD ELOS: 11-14 days Medical Rehab Prognosis:  Excellent Assessment: The patient has been admitted for CIR therapies with the diagnosis of TBI with polytrauma. The team will be addressing functional mobility, strength, stamina, balance, safety, adaptive techniques and equipment, self-care, bowel and bladder mgt, patient and caregiver education, NMR, cogntive perceptual rx, community reentry, pain control, ortho precautions. Goals have been set at mod I to supervision for mobility and self-care.   Due to the current state of emergency, patients may not be receiving their 3 hours per day of Medicare-mandated therapy.    Meredith Staggers, MD, FAAPMR      See Team Conference Notes for weekly updates to the plan of care

## 2018-08-11 NOTE — Care Management (Signed)
Inpatient [place] Individual Statement of Services  Patient Name:  Sandra Montoya  Date:  08/11/2018  Welcome to the [facility].  Our goal is to provide you with an individualized program based on your diagnosis and situation, designed to meet your specific needs.  With this comprehensive rehabilitation program, you will be expected to participate in at least 3 hours of rehabilitation therapies Monday-Friday, with modified therapy programming on the weekends.  Your rehabilitation program will include the following services:  Physical Therapy (PT), Occupational Therapy (OT), 24 hour per day rehabilitation nursing, Therapeutic Recreaction (TR), Neuropsychology, Case Management (Social Worker), Rehabilitation Medicine, Nutrition Services and Pharmacy Services  Weekly team conferences will be held on Tuesdays to discuss your progress.  Your Social Worker will talk with you frequently to get your input and to update you on team discussions.  Team conferences with you and your family in attendance may also be held.  Expected length of stay: 2 weeks   Overall anticipated outcome: minimal assistance  Depending on your progress and recovery, your program may change. Your Social Worker will coordinate services and will keep you informed of any changes. Your Social Worker's name and contact numbers are listed  below.  The following services may also be recommended but are not provided by the [place] will be made to provide these services after discharge if needed.  Arrangements include referral to agencies that provide these services.  Your insurance has been verified to be:  None Your primary doctor is:  [place]  Pertinent information will be shared with your doctor and your  insurance company.  Social Worker:  [place], [place] or ([phone]   Information discussed with and copy given to patient by: Lennart Pall, 08/11/2018, 9:34 AM
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
## 2018-08-12 ENCOUNTER — Inpatient Hospital Stay (HOSPITAL_COMMUNITY): Payer: Self-pay | Admitting: Rehabilitation

## 2018-08-12 ENCOUNTER — Inpatient Hospital Stay (HOSPITAL_COMMUNITY): Payer: Self-pay

## 2018-08-12 NOTE — Progress Notes (Signed)
Occupational Therapy Session Note  Patient Details  Name: Sandra Montoya MRN: [id number] Date of Birth: 10/15/1960  Today's Date: 08/12/2018 OT Individual Time: 1030-1130 and 803-914 OT Individual Time Calculation (min): 60 min and 75 min (session 1)   Short Term Goals: Week 1:  OT Short Term Goal 1 (Week 1): Pt will complete toilet/BSc transfer wiht S OT Short Term Goal 2 (Week 1): Pt will recall hemi dressing technqiues wiht no VC OT Short Term Goal 3 (Week 1): Pt will advance pants past hips with CGA OT Short Term Goal 4 (Week 1): Pt will don cam boot wiht no VC  Skilled Therapeutic Interventions/Progress Updates:     1;1. 7/10 pain reported. Premedicated just prior to OT arrival. Pt received in w/c with RW after getting off toilet. Education provided on wearing CAM boot for all transfers. Pt completes grooming at sink seated in w/c with set up. Pt completes dressing at sink with S for 1 subtle cue to advance R sleeve past elbow prior to threading head for UB and MIN A for LB at sit to stand level. VC for locking w/c brakes when reaching forward for safety provided when pt washing feet. Pt requires instructional cues to fix strap problem with cam boot. Pt completes toileting with CGA and VC for keeping 1 hand pn grab bar at all times during CM and NWB precaution reinforcement. Pt completes couch transfer with education regarding sitting on R side of couch to have R arm to push up on armrest. Edu related to organization of items in kitchen to retrieve from w/c level and pt completes kitchen search obtaining items from variosu heights at w/c level. Pt completes CGA transfer to TTB with VC for sequencing. Pt completes 2x managing elevated leg rest with VC for which handle to push and directional cues. Exited session with pt seated in w/c, exit alarm on and call light in reach  Session 2: Pt recived in bed reporting 8/10 pain premedicated but willing to particiate in tx. Pt completes stand pivot  transfers with RW with CGA and CAM donned EOB<>w/c<>BSC over toilet. Pt with better safety awareness with hand placement during transitional movement this session. Pt completes w/c mobility to all tx destinations and LE rest management with increased time. Pt completes 4x 1 min intervals on scifit arm bike for AROM at shoulder under 90* with 1 min rest breaks on resistance level 1. Pt completes folding towels seated EOM reaching to towels at shoulder height and crossing body to place in bin with LUE. Pt reaches laterally and across midline in B directions with LUE for AROM of L shoulder. Exited session after toileting prior to entering bed with exit alarm on and call light inr each  Therapy Documentation Precautions:  Precautions Precautions: Fall Precaution Comments: LUE sling for comfort only, LUE ROM as tolerated Required Braces or Orthoses: Sling, Other Brace Other Brace: CAM boot RLE Restrictions Weight Bearing Restrictions: Yes LUE Weight Bearing: Weight bearing as tolerated RLE Weight Bearing: Non weight bearing LLE Weight Bearing: Weight bearing as tolerated Other Position/Activity Restrictions: R UE allowed AROm to 90 degrees and AROM Elbow wrist and hand General:   Vital Signs: Therapy Vitals Temp: 98.2 F (36.8 C) Pulse Rate: 84 Resp: (!) 24 BP: (!) 153/85 Patient Position (if appropriate): Lying Oxygen Therapy SpO2: 99 % O2 Device: Room Air Pain:   ADL:   Vision   Perception    Praxis   Exercises:   Other Treatments:  Therapy/Group: Individual Therapy  Tonny Branch 08/12/2018, 8:11 AM

## 2018-08-12 NOTE — Progress Notes (Signed)
Physical Therapy Session Note  Patient Details  Name: Sandra Montoya MRN: [id number] Date of Birth: [birth year]  Today's Date: 08/12/2018 PT Individual Time: 1303-1400 PT Individual Time Calculation (min): 57 min   Short Term Goals: Week 1:  PT Short Term Goal 1 (Week 1): Pt will perform supine<>sit with CGA PT Short Term Goal 2 (Week 1): Pt will perform bed<>chair transfers with CGA PT Short Term Goal 3 (Week 1): Pt will perform sit<>stand with min assist using LRAD PT Short Term Goal 4 (Week 1): Pt will ambulate 41ft using LRAD with mod assist Week 2:     Skilled Therapeutic Interventions/Progress Updates:   Pt received lying in bed, agreeable to therapy session.  Transferred to w/c from bed via squat pivot at min/guard level and tactile cues for maintaining R LE weight bearing precautions.  Pt reporting she needed to use restroom, therefore assisted w/c into restroom at total A.  Transferred in same manner.  Pt did very well balancing on LLE using R hand on rail and managing clothing with LUE.  Transferred back to w/c as above.  Pt able to perform all peri care independently in sitting position.  Pt self propelled w/c using LLE and BUEs at S level, min cues for proper technique as she tends to do arms at different times.  Once in therapy gym, utilized RW to transfer to mat at min A level.  Max cues for staying close to RW and hopping rather than scooting LLE.  Once on therapy mat, practiced standing with hopping in place x 10 reps.  Cues for improved lat activation for shoulder depression and scapular depression and adduction.  Transitioned to supine therex: SLR x 10 reps on each side with cues for improved eccentric contraction, L SL R clam abduction x 10 reps, R SL L hip abd x 10 reps, LLE only bridging x 10 reps with PT holding RLE to ensure WB status.  Performed gait x 2 reps of 10' with RW at min A level.  Marked improvement with keeping RLE off of ground and lifting LLE to hop.  Seated UE  exericses with orange theraband: scapular retraction, shoulder extension x 10 reps (little resistance), ER x 10 reps (little resistance).   Therapy Documentation Precautions:  Precautions Precautions: Fall Precaution Comments: LUE sling for comfort only, LUE ROM as tolerated Required Braces or Orthoses: Sling, Other Brace Other Brace: CAM boot RLE Restrictions Weight Bearing Restrictions: Yes LUE Weight Bearing: Weight bearing as tolerated RLE Weight Bearing: Non weight bearing LLE Weight Bearing: Weight bearing as tolerated Other Position/Activity Restrictions: R UE allowed AROm to 90 degrees and AROM Elbow wrist and hand  Pain: 5/10    Therapy/Group: Individual Therapy  Denice Bors 08/12/2018, 1:24 PM
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
## 2018-08-13 ENCOUNTER — Inpatient Hospital Stay (HOSPITAL_COMMUNITY): Payer: Self-pay

## 2018-08-13 NOTE — Progress Notes (Signed)
Occupational Therapy Session Note  Patient Details  Name: Sandra Montoya MRN: [id number] Date of Birth: 04/16/1960  Today's Date: 08/13/2018 OT Individual Time: 0700-0743 OT Individual Time Calculation (min): 43 min    Short Term Goals: Week 1:  OT Short Term Goal 1 (Week 1): Pt will complete toilet/BSc transfer wiht S OT Short Term Goal 2 (Week 1): Pt will recall hemi dressing technqiues wiht no VC OT Short Term Goal 3 (Week 1): Pt will advance pants past hips with CGA OT Short Term Goal 4 (Week 1): Pt will don cam boot wiht no VC  Skilled Therapeutic Interventions/Progress Updates:    1:1. Pt received in bed agreeable to OT. Pt requesting to shower this morning. Pt completes doffing clothing with lateral leans on EOB prior to OT bagging RLE to cover incision with watreproof dressing. Pt dons CAM boot and complete CGA squat pivot EOB<>w/c<>BSC<>shower chair with min VC for hand placement and NWB precautions. Pt completes bathing seated on shower seat with A for washing back and laterl leans to wash buttocks and distant supervision overall. Pt complets dressing in w/c at sit to stand level with set up for donning shirt and supervision for sit to stand at sink to advance pants past hips. Pt demo decreased safety awareness when donning pants as pt was about to stand without walker or sink in front of pt if OT had not intervened. Pt verbalized understanding of education however will need reinforcement Pt grooms seated at sink with set up. Exited session with pt seated inw/c, call light in reach and all needs met  Therapy Documentation Precautions:  Precautions Precautions: Fall Precaution Comments: LUE sling for comfort only, LUE ROM as tolerated Required Braces or Orthoses: Sling, Other Brace Other Brace: CAM boot RLE Restrictions Weight Bearing Restrictions: Yes LUE Weight Bearing: Weight bearing as tolerated RLE Weight Bearing: Non weight bearing LLE Weight Bearing: Weight bearing as  tolerated Other Position/Activity Restrictions: R UE allowed AROm to 90 degrees and AROM Elbow wrist and hand General:   Vital Signs: Therapy Vitals Temp: 98.4 F (36.9 C) Temp Source: Oral Pulse Rate: 84 Resp: 18 BP: (!) 143/96 Patient Position (if appropriate): Lying Oxygen Therapy SpO2: 99 % O2 Device: Room Air Pain: Pain Assessment Pain Scale: 0-10 Pain Score: 8  Pain Type: Acute pain Pain Location: Shoulder Pain Orientation: Left Pain Descriptors / Indicators: Aching Pain Frequency: Intermittent Pain Onset: On-going Patients Stated Pain Goal: 2 Pain Intervention(s): Medication (See eMAR) ADL:   Vision   Perception    Praxis   Exercises:   Other Treatments:     Therapy/Group: Individual Therapy  Stephanie M Schlosser 08/13/2018, 7:17 AM 

## 2018-08-13 NOTE — Progress Notes (Signed)
Occupational Therapy Session Note  Patient Details  Name: Sandra Montoya MRN: [id number] Date of Birth: [birth year]  Today's Date: 08/13/2018 OT Individual Time: 1400-1445 OT Individual Time Calculation (min): 45 min    Short Term Goals: Week 1:  OT Short Term Goal 1 (Week 1): Pt will complete toilet/BSc transfer wiht S OT Short Term Goal 2 (Week 1): Pt will recall hemi dressing technqiues wiht no VC OT Short Term Goal 3 (Week 1): Pt will advance pants past hips with CGA OT Short Term Goal 4 (Week 1): Pt will don cam boot wiht no VC  Skilled Therapeutic Interventions/Progress Updates:    1:1. Pt received in bed with c/o "typical" shoulder pain and declines intervention. Ot applies coban to color code straps on CAM boot and pt able to don without cues. Pt given word search puzzles for leisure after therapy. Pt completes stand pivot transfers with CGA-S from EOB<>w/c<>toilet with RW and 1 LOB during CM with grab bar with min A to recover. Pt completes w/c mobility with set up to tx gym. Pt stands from [facility] with VC for hand placement to play  Rounds of horse shoes with no LOB and S-CGA. Exited session with pt seated in bed, exit alarm on and call light in reach  Therapy Documentation Precautions:  Precautions Precautions: Fall Precaution Comments: LUE sling for comfort only, LUE ROM as tolerated Required Braces or Orthoses: Sling, Other Brace Other Brace: CAM boot RLE Restrictions Weight Bearing Restrictions: Yes LUE Weight Bearing: Weight bearing as tolerated RLE Weight Bearing: Non weight bearing LLE Weight Bearing: Weight bearing as tolerated Other Position/Activity Restrictions: R UE allowed AROm to 90 degrees and AROM Elbow wrist and hand General:   Vital Signs: Therapy Vitals Temp: 98 F (36.7 C) Pulse Rate: 88 Resp: 19 BP: 129/70 Patient Position (if appropriate): Lying Oxygen Therapy SpO2: 98 % O2 Device: Room Air Pain:   ADL:   Vision   Perception    Praxis    Exercises:   Other Treatments:     Therapy/Group: Individual Therapy  Tonny Branch 08/13/2018, 2:43 PM

## 2018-08-13 NOTE — Plan of Care (Signed)
  Problem: Consults Goal: RH GENERAL PATIENT EDUCATION Description: See Patient Education module for education specifics. Outcome: Progressing   Problem: RH BOWEL ELIMINATION Goal: RH STG MANAGE BOWEL WITH ASSISTANCE Description: STG Manage Bowel with min Assistance. Outcome: Progressing Goal: RH STG MANAGE BOWEL W/MEDICATION W/ASSISTANCE Description: STG Manage Bowel with Medication with min Assistance. Outcome: Progressing   Problem: RH BLADDER ELIMINATION Goal: RH STG MANAGE BLADDER WITH ASSISTANCE Description: STG Manage Bladder With min Assistance Outcome: Progressing   Problem: RH SKIN INTEGRITY Goal: RH STG MAINTAIN SKIN INTEGRITY WITH ASSISTANCE Description: STG Maintain Skin Integrity With min Assistance. Outcome: Progressing Goal: RH STG ABLE TO PERFORM INCISION/WOUND CARE W/ASSISTANCE Description: STG Able To Perform Incision/Wound Care With min Assistance. Outcome: Progressing   Problem: RH SAFETY Goal: RH STG ADHERE TO SAFETY PRECAUTIONS W/ASSISTANCE/DEVICE Description: STG Adhere to Safety Precautions With min Assistance and appropriate assistive Device. Outcome: Progressing Goal: RH STG DECREASED RISK OF FALL WITH ASSISTANCE Description: STG Decreased Risk of Fall With min Assistance. Outcome: Progressing   Problem: RH PAIN MANAGEMENT Goal: RH STG PAIN MANAGED AT OR BELOW PT'S PAIN GOAL Description: <4 on a 0-10 pain scale Outcome: Progressing   Problem: RH KNOWLEDGE DEFICIT GENERAL Goal: RH STG INCREASE KNOWLEDGE OF SELF CARE AFTER HOSPITALIZATION Description: Patient will be able to demonstrate knowledge of how to take care of wounds, take medications, manage weight bearing restrictions with min assist from rehab staff. Outcome: Progressing
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
## 2018-08-14 ENCOUNTER — Inpatient Hospital Stay (HOSPITAL_COMMUNITY): Payer: Self-pay | Admitting: Occupational Therapy

## 2018-08-14 ENCOUNTER — Inpatient Hospital Stay (HOSPITAL_COMMUNITY): Payer: Self-pay | Admitting: Physical Therapy

## 2018-08-14 ENCOUNTER — Inpatient Hospital Stay (HOSPITAL_COMMUNITY): Payer: Self-pay

## 2018-08-14 ENCOUNTER — Encounter (HOSPITAL_COMMUNITY): Payer: Self-pay | Admitting: Psychology

## 2018-08-14 LAB — CBC
HCT: 28 % — ABNORMAL LOW (ref 36.0–46.0)
Hemoglobin: 9.2 g/dL — ABNORMAL LOW (ref 12.0–15.0)
MCH: 32.2 pg (ref 26.0–34.0)
MCHC: 32.9 g/dL (ref 30.0–36.0)
MCV: 97.9 fL (ref 80.0–100.0)
Platelets: 581 10*3/uL — ABNORMAL HIGH (ref 150–400)
RBC: 2.86 MIL/uL — ABNORMAL LOW (ref 3.87–5.11)
RDW: 12.6 % (ref 11.5–15.5)
WBC: 11.2 10*3/uL — ABNORMAL HIGH (ref 4.0–10.5)
nRBC: 0.2 % (ref 0.0–0.2)

## 2018-08-14 LAB — BASIC METABOLIC PANEL
Anion gap: 14 (ref 5–15)
BUN: 26 mg/dL — ABNORMAL HIGH (ref 6–20)
CO2: 23 mmol/L (ref 22–32)
Calcium: 9.8 mg/dL (ref 8.9–10.3)
Chloride: 98 mmol/L (ref 98–111)
Creatinine, Ser: 0.95 mg/dL (ref 0.44–1.00)
GFR calc Af Amer: >60 mL/min (ref >60)
GFR calc non Af Amer: >60 mL/min (ref >60)
Glucose, Bld: 108 mg/dL — ABNORMAL HIGH (ref 70–99)
Potassium: 4.4 mmol/L (ref 3.5–5.1)
Sodium: 135 mmol/L (ref 135–145)

## 2018-08-14 MED ORDER — AMLODIPINE BESYLATE 5 MG PO TABS
5.0000 mg | ORAL_TABLET | Freq: Every day | ORAL | Status: DC
  Administered 2018-08-14 – 2018-08-19 (×6): 5 mg via ORAL
  Filled 2018-08-14 (×6): qty 1

## 2018-08-14 NOTE — Progress Notes (Signed)
Physical Therapy Session Note  Patient Details  Name: Sandra Montoya MRN: [id number] Date of Birth: 06/11/1960  Today's Date: 08/14/2018 PT Individual Time: 0800-0855 PT Individual Time Calculation (min): 55 min   Short Term Goals: Week 1:  PT Short Term Goal 1 (Week 1): Pt will perform supine<>sit with CGA PT Short Term Goal 2 (Week 1): Pt will perform bed<>chair transfers with CGA PT Short Term Goal 3 (Week 1): Pt will perform sit<>stand with min assist using LRAD PT Short Term Goal 4 (Week 1): Pt will ambulate 71ft using LRAD with mod assist  Skilled Therapeutic Interventions/Progress Updates:    Discussed goals and d/c planning while RN performing dressing change on R heel. Pt reports planning to stay at daughters house for a little while which she does report has 3 steps to get in. She states they will just carry her up. May need education on bumping up in w/c if this is the case for safer technique. She states she will not stay there "too long." Pt dressed seated EOB at supervision level and min assist for sit <> stand and CGA for standing balance while pulling pants up with RW for support. Pt performed CGA transfer with RW to w/c with cues for NWB status. Pt was able to don CAM boot with assist for set-up/placement of boot and independent for donning L shoe and sock prior to transfers. Hygiene at sink performed independently for face washing and oral hygiene. Educated on w/c parts management and use of legrests as well as mobility training with BUE and LLE in hallway and through obstacle course to simulate home environment at supervision level with cues for technique. Practiced multiple repetitions of taking R legrest off and on requiring some assist with placing it back on and cues locking of brakes and positioning of legrest. Educated and discussed various scenarios in home with use of w/c vs RW (not going to transport RW back and forth throughout house) and ways to conserve energy. Recommended  leaving L legrest off to decrease effort to have to remove and place during transfers as well as if possible, transferring to the L to limit number of times she would need to remove R elevating legrest when alone in her apartment. Pt in agreement and understanding. Short distance gait training with RW to simulate bathroom access with overall CGA for this sit <> stand and during gait with improved technique noted during gait to utilize scapular depression vs hopping on L foot. Performed toilet transfers end of session usign grab bar for support with overall CGA for transfer and balance during clothing management and hygiene.   Therapy Documentation Precautions:  Precautions Precautions: Fall Precaution Comments: LUE sling for comfort only, LUE ROM as tolerated Required Braces or Orthoses: Sling, Other Brace Other Brace: CAM boot RLE Restrictions Weight Bearing Restrictions: Yes LUE Weight Bearing: Weight bearing as tolerated RLE Weight Bearing: Non weight bearing LLE Weight Bearing: Weight bearing as tolerated Other Position/Activity Restrictions: R UE allowed AROm to 90 degrees and AROM Elbow wrist and hand Pain: Pain Assessment Pain Scale: 0-10 Pain Score: 8  Pain Type: Acute pain Pain Location: Shoulder Pain Orientation: Left Pain Descriptors / Indicators: Aching Pain Frequency: Intermittent Pain Onset: With Activity Patients Stated Pain Goal: 1 Pain Intervention(s): Medication (See eMAR)    Therapy/Group: Individual Therapy  Canary Brim Ivory Broad, PT, DPT, CBIS  08/14/2018, 9:34 AM

## 2018-08-14 NOTE — Progress Notes (Signed)
Occupational Therapy Session Note  Patient Details  Name: Sandra Montoya MRN: [id number] Date of Birth: 07/03/1960  Today's Date: 08/14/2018 OT Individual Time: 1300-1410 OT Individual Time Calculation (min): 70 min    Short Term Goals: Week 1:  OT Short Term Goal 1 (Week 1): Pt will complete toilet/BSc transfer wiht S OT Short Term Goal 2 (Week 1): Pt will recall hemi dressing technqiues wiht no VC OT Short Term Goal 3 (Week 1): Pt will advance pants past hips with CGA OT Short Term Goal 4 (Week 1): Pt will don cam boot wiht no VC  Skilled Therapeutic Interventions/Progress Updates:    Pt greeted sitting EOB with bed alarm going off stating she needed to use the bathroom. Pt completed squat-pivot bed>wc>BSC over toilet>wc with CGA and verbal cues for NWB RLE. CGA for balance when standing to doff pants. Pt voided bowel and bladder and completed peri-care without assistance. Pt needed CGA and verbal cues to maintain R LE NWB when standing to pull pants back up. Pt washed hands at the sink from wc level. Pt propelled wc to therapy day room. Worked on sit<>stand and standing balance/endurance while maintaining NWB R LE. Pt played card game in standing with CGA for balance and min cues to keep R LE off of the floor. Pt propelled wc to therapy gym and OT reviewed wc functions for leg rest. Pt able to remove leg rest, then completed squat-pivot to therapy mat with CGA. UB there-ex using unweighted plastic rod. 3 sets of 10 bicep curls and triceps press. Brought pt into supine and assisted with AROM on L shoulder. Pt able to get L shoulder to 100 degrees of forward flexion. 3 sets of 10 shoulder flexion with guided A from OT. Pt propelled wc back to room into bathroom and completed stand-pivot back to toilet with use of grab bars.  Pt voided bladder and completed 3/3 toileting steps with CGA. Pt returned to bed with close supervision to pivot and verbal cues for NWB R LE. Pt left semi-reclined in bed with bed  alarm on and needs met.    Therapy Documentation Precautions:  Precautions Precautions: Fall Precaution Comments: LUE sling for comfort only, LUE ROM as tolerated Required Braces or Orthoses: Sling, Other Brace Other Brace: CAM boot RLE Restrictions Weight Bearing Restrictions: Yes LUE Weight Bearing: Weight bearing as tolerated RLE Weight Bearing: Non weight bearing LLE Weight Bearing: Weight bearing as tolerated Other Position/Activity Restrictions: R UE allowed AROm to 90 degrees and AROM Elbow wrist and hand Pain:  8/10 pain in R LE. Repositioned for comfort \  Therapy/Group: Individual Therapy  Valma Cava 08/14/2018, 2:18 PM

## 2018-08-14 NOTE — Progress Notes (Signed)
Patient gave herself a lovenox shot with verbal instruction from RN. Verbalized comfort with procedure.

## 2018-08-14 NOTE — Progress Notes (Signed)
Physical Therapy Session Note  Patient Details  Name: Sandra Montoya MRN: [id number] Date of Birth: [birth year]  Today's Date: 08/14/2018 PT Individual Time: 1004-1101 PT Individual Time Calculation (min): 57 min   Short Term Goals: Week 1:  PT Short Term Goal 1 (Week 1): Pt will perform supine<>sit with CGA PT Short Term Goal 2 (Week 1): Pt will perform bed<>chair transfers with CGA PT Short Term Goal 3 (Week 1): Pt will perform sit<>stand with min assist using LRAD PT Short Term Goal 4 (Week 1): Pt will ambulate 41ft using LRAD with mod assist  Skilled Therapeutic Interventions/Progress Updates:  Pt received in w/c & agreeable to tx, reporting 8/10 pain in RLE but reports being premedicated & rest breaks provided PRN. Pt propels w/c around unit with BUE & LLE with cuing to use LUE for ROM & strengthening purposes and distant supervision. Pt requires cuing to lock w/c brakes before sit>stand but is able to swing RLE leg rest out of the way. Pt transfers sit<>Stand with poor compliance of NWB RLE. Pt ambulates 12 ft + 12 ft + 15 ft with RW & min assist with improvement in maintaining RLE NWB during gait. Pt utilized cybex kinetron in sitting with LLE & therapist operating opposite pedal, with task focusing on LLE strengthening, 3 trials x 1 minute each.  Pt propels w/c between cones to simulate potential small spaces in home/community environment with supervision and initial cuing fade to distant supervision with improving ability to maneuver around cones. While standing with RW & CGA<>min assist for balance pt performs RLE marching, hip abduction, and hamstring curls all 2 sets x 20 reps for RLE strengthening with instructional multimodal cuing for technique. Pt is able to place R ELR back on w/c without assistance at end of session. Pt reports need to use restroom and is assisted in/out of bathroom in w/c dependent assist for time management. Pt transfers stand pivot to elevated seat over toilet with  CGA and grab bar & manages clothing without assistance. Pt with continent void & performs peri hygiene sitting independently, and hand hygiene at sink from w/c level with set up assist. At end of session pt left sitting in w/c with chair alarm donned & all needs at hand, RN in room. Pt reports fatigue at end of session.  Educated pt that her family may have to come in for family training if she plans to d/c to her daughters house to allow them to be educated on & practice bumping her up/down steps in w/c with pt reporting she is aware & informed her daughter already.   Therapy Documentation Precautions:  Precautions Precautions: Fall Precaution Comments: LUE sling for comfort only, LUE ROM as tolerated Other Brace Other Brace: CAM boot RLE Restrictions Weight Bearing Restrictions: Yes LUE Weight Bearing: Weight bearing as tolerated RLE Weight Bearing: Non weight bearing LLE Weight Bearing: Weight bearing as tolerated    Therapy/Group: Individual Therapy  Waunita Schooner 08/14/2018, 11:03 AM
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
## 2018-08-15 ENCOUNTER — Inpatient Hospital Stay (HOSPITAL_COMMUNITY): Payer: Self-pay | Admitting: Occupational Therapy

## 2018-08-15 ENCOUNTER — Inpatient Hospital Stay (HOSPITAL_COMMUNITY): Payer: Self-pay

## 2018-08-15 ENCOUNTER — Inpatient Hospital Stay (HOSPITAL_COMMUNITY): Payer: Self-pay | Admitting: Physical Therapy

## 2018-08-15 NOTE — Progress Notes (Signed)
Occupational Therapy Session Note  Patient Details  Name: Sandra Montoya MRN: [id number] Date of Birth: 12-Jul-1960  Today's Date: 08/15/2018 OT Individual Time: 1115-1200 OT Individual Time Calculation (min): 45 min    Short Term Goals: Week 1:  OT Short Term Goal 1 (Week 1): Pt will complete toilet/BSc transfer wiht S OT Short Term Goal 2 (Week 1): Pt will recall hemi dressing technqiues wiht no VC OT Short Term Goal 3 (Week 1): Pt will advance pants past hips with CGA OT Short Term Goal 4 (Week 1): Pt will don cam boot wiht no VC  Skilled Therapeutic Interventions/Progress Updates:    Pt seen this session to work on toileting and toilet transfers with and without the RW and L shoulder AROM.  Pt received in bed and sat to EOB and used RW to ambulate to bathroom using L foot only with min A to support her balance.  CGA in standing with clothing management. Transferred stand pivot to wc with S. Self propelled to the gym.  Worked on gravity supported L shoulder AROM using the UE ranger with sh flex/ extension gradually increasing the ROM to her tolerance and with circular movement patterns.  B hands on light dowel bar for sh flex to 80 and rotation twists alternating hands pushing forward. Pt self propelled wc back to room and completed toileting 2nd time using stand pivots with S.  Pt resting in w/c with ice pack on shoulder and wc pad alarm on. All needs met.   Therapy Documentation Precautions:  Precautions Precautions: Fall Precaution Comments: LUE sling for comfort only, LUE ROM as tolerated Required Braces or Orthoses: Sling, Other Brace Other Brace: CAM boot RLE Restrictions Weight Bearing Restrictions: Yes LUE Weight Bearing: Weight bearing as tolerated RLE Weight Bearing: Non weight bearing LLE Weight Bearing: Weight bearing as tolerated Other Position/Activity Restrictions: LUE allowed AROM to 90 degrees and AROM Elbow wrist and hand       Pain: Pain Assessment Pain  Score: 7  Pain Type: Surgical pain Pain Location: Shoulder Pain Orientation: Left Pain Descriptors / Indicators: Aching Pain Onset: On-going Pain Intervention(s): Rest;Cold applied     Therapy/Group: Individual Therapy  [place] 08/15/2018, 12:19 PM
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
## 2018-08-15 NOTE — Progress Notes (Signed)
Physical Therapy Session Note  Patient Details  Name: Sandra Montoya MRN: [id number] Date of Birth: 13-Oct-1960  Today's Date: 08/15/2018 PT Individual Time: 7902-4097 and 3532-9924 PT Individual Time Calculation (min): 73 min and 26 min  Short Term Goals: Week 1:  PT Short Term Goal 1 (Week 1): Pt will perform supine<>sit with CGA PT Short Term Goal 2 (Week 1): Pt will perform bed<>chair transfers with CGA PT Short Term Goal 3 (Week 1): Pt will perform sit<>stand with min assist using LRAD PT Short Term Goal 4 (Week 1): Pt will ambulate 82ft using LRAD with mod assist  Skilled Therapeutic Interventions/Progress Updates:  Treatment 1: Pt received in w/c donning CAM boot and does so without assistance. Pt is able to place R ELR on/off w/c without assistance. Pt reports "just a little" pain in L shoulder but then rates it 7/10 with pt reporting she is premedicated & rest breaks/repositioning provided PRN during session. Pt propelled w/c around unit with mod I & BUE, LLE (pt does use RUE only at times with LUE begins to hurt). Reviewed need to maintain weight bearing precautions during transfers, especially sit>stand, with pt demonstrating improvement in this as session progressing. Pt transfers sit<>stand with CGA & ambulates 20 ft + 20 ft with RW & CGA with improving LLE foot clearance and increased LLE step length. Pt completes car transfer at sedan simulated height with RW & CGA with improving safety awareness. Pt propels w/c over ramp to simulate community mobility with instructional cuing and no physical assistance. Provided pt with w/c gloves to prevent BUE skin breakdown while propelling w/c. Provided pt with step-by-step instruction for w/c set up and squat pivot with pt completing transfer w/c<>mat table with supervision and maintains NWB RLE. Pt transferred sit<>supine on mat table with independence & is provided with HEP handout. Pt performs the following RLE strengthening exercises with  instructional cuing for technique: short arc quads, heel slides, hip abduction slides, glute/quad sets, and LUE AROM flexion (pt unable to achieve 90 degrees L shoulder flexion & unable to fully extend L elbow) exercises. Pt is able to perform all exercises 2 sets x 15 reps without cuing during 2nd set. Back in room pt reports need to use restroom. Pt transfers w/c<>toilet with grab bar and close supervision with min cuing for NWB RLE. Pt is able to manage clothing without assistance, has continent void & performs peri hygiene. Pt completes hand hygiene sitting at sink from w/c level. Pt doffs cam boot without assistance and transfers w/c>bed via squat pivot with supervision with pt requiring cuing to move arm rest out of the way. At end of session pt left in bed with alarm set & all needs at hand.  Reviewed x-ray images of LUE & R LE with pt when pt was asking about surgeries.   Treatment 2: Pt received in bed & agreeable to tx. Pt reports 7/10 pain in LUE & RLE but reports she is premedicated. Pt transitions to sitting EOB & dons CAM boot without assistance. Pt transfers bed>w/c with close supervision and stand pivot. Transported pt in/out of bathroom for time management and pt transfers stand pivot w/c<>toilet with grab bar, supervision, and cuing for NWB RLE. Pt manages clothing & has continent void on toilet, performing hand hygiene at sink from w/c level. Pt propels w/c around unit with mod I. Pt completes w/c<>couch transfer via squat pivot with distant supervision with only occasional prompting to arrange w/c. Educated pt on recommendation of performing squat pivots  only at home when by herself, and using RW for stand pivot & gait with assistance from daughter. Pt transferred sit<>stand with CGA & cuing for NWB RLE. Pt stands with RW to engage in corn hole with frequent cuing to lift RLE off floor, CGA for balance, with task focusing on dynamic standing balance. At end of session pt left in w/c with  alarm set & all needs in reach.    Therapy Documentation Precautions:  Precautions Precautions: Fall Precaution Comments: LUE sling for comfort only, LUE ROM as tolerated Required Braces or Orthoses: Other Brace Other Brace: CAM boot RLE Restrictions Weight Bearing Restrictions: Yes LUE Weight Bearing: Weight bearing as tolerated RLE Weight Bearing: Non weight bearing LLE Weight Bearing: Weight bearing as tolerated    Therapy/Group: Individual Therapy  Sandi MariscalVictoria M Clenton Esper 08/15/2018, 3:32 PM
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
## 2018-08-15 NOTE — Progress Notes (Signed)
Occupational Therapy Session Note  Patient Details  Name: Sandra Montoya MRN: [id number] Date of Birth: [birth year]  Today's Date: 08/15/2018 OT Individual Time: 1954-2481 OT Individual Time Calculation (min): 43 min    Short Term Goals: Week 1:  OT Short Term Goal 1 (Week 1): Pt will complete toilet/BSc transfer wiht S OT Short Term Goal 2 (Week 1): Pt will recall hemi dressing technqiues wiht no VC OT Short Term Goal 3 (Week 1): Pt will advance pants past hips with CGA OT Short Term Goal 4 (Week 1): Pt will don cam boot wiht no VC  Skilled Therapeutic Interventions/Progress Updates:    Session focused on toileting tasks, standing balance, and BUE AROM.  Pt completed stand pivot transfer bed > w/c > toilet with min A. Pt completed all toileting tasks with CGA. Pt propelled w/c to therapy gym with min cueing for technique. Pt sat EOM and completed standing level functional reaching task with beep board placed underneath R LE to ensure NWB status adhered to. Pt did excellent with no indications of breaking precautions and no LOB with unilateral support on RW. Pt then sat and completed gentle BUE closed chain AROM using a ball. Pt guided through exercises with a focus on functional movements through all planes. No pain reported throughout. Pt returned to her room, used the bathroom once more, and then returned to supine in bed with all needs met. Bed alarm set.   Therapy Documentation Precautions:  Precautions Precautions: Fall Precaution Comments: LUE sling for comfort only, LUE ROM as tolerated Required Braces or Orthoses: Sling, Other Brace Other Brace: CAM boot RLE Restrictions Weight Bearing Restrictions: Yes LUE Weight Bearing: Weight bearing as tolerated RLE Weight Bearing: Non weight bearing LLE Weight Bearing: Weight bearing as tolerated Other Position/Activity Restrictions: R UE allowed AROm to 90 degrees and AROM Elbow wrist and hand   Therapy/Group: Individual Therapy  Curtis Sites 08/15/2018, 7:25 AM

## 2018-08-16 ENCOUNTER — Inpatient Hospital Stay (HOSPITAL_COMMUNITY): Payer: Self-pay | Admitting: Occupational Therapy

## 2018-08-16 ENCOUNTER — Inpatient Hospital Stay (HOSPITAL_COMMUNITY): Payer: Self-pay | Admitting: Physical Therapy

## 2018-08-16 ENCOUNTER — Inpatient Hospital Stay (HOSPITAL_COMMUNITY): Payer: Self-pay

## 2018-08-16 NOTE — Progress Notes (Signed)
Occupational Therapy Session Note  Patient Details  Name: Sandra Montoya MRN: [id number] Date of Birth: August 01, 1960  Today's Date: 08/16/2018 OT Individual Time: 3976-7341 OT Individual Time Calculation (min): 60 min    Short Term Goals: Week 1:  OT Short Term Goal 1 (Week 1): Pt will complete toilet/BSc transfer wiht S OT Short Term Goal 2 (Week 1): Pt will recall hemi dressing technqiues wiht no VC OT Short Term Goal 3 (Week 1): Pt will advance pants past hips with CGA OT Short Term Goal 4 (Week 1): Pt will don cam boot wiht no VC  Skilled Therapeutic Interventions/Progress Updates:    1:! Pt was on the toilet when arrived (without CAM boot donned) educated she needed to wear it all the time. Transferred into the shower sitting on the [facility] with supervision. After discussing with RN CAM boot left off and washed right foot careful and redressed afterwards. Pt able to bathe and dress self with setup. Pt demonstrated WFL ROM in left elbow and distally. Pt is able to lift shoulder to 90 degrees. Pt performed w/c mobility in an obstacle course with supervision with extra time. Pt does require A to don elevated leg rest. Left sitting up in the room in w/c.  Therapy Documentation Precautions:  Precautions Precautions: Fall Precaution Comments: LUE sling for comfort only, LUE ROM as tolerated Required Braces or Orthoses: Sling, Other Brace Other Brace: CAM boot RLE Restrictions Weight Bearing Restrictions: Yes LUE Weight Bearing: Weight bearing as tolerated RLE Weight Bearing: Non weight bearing LLE Weight Bearing: Weight bearing as tolerated Other Position/Activity Restrictions: LUE allowed AROM to 90 degrees and AROM Elbow wrist and hand Pain: Pain Assessment Pain Scale: 0-10 Pain Score: 0-No pain   Therapy/Group: Individual Therapy  Willeen Cass [facility] 08/16/2018, 1:40 PM

## 2018-08-16 NOTE — Progress Notes (Signed)
Physical Therapy Session Note  Patient Details  Name: Sandra Montoya MRN: [id number] Date of Birth: 10-08-1960  Today's Date: 08/16/2018 PT Individual Time: 0809-0904 PT Individual Time Calculation (min): 55 min   Short Term Goals: Week 1:  PT Short Term Goal 1 (Week 1): Pt will perform supine<>sit with CGA PT Short Term Goal 2 (Week 1): Pt will perform bed<>chair transfers with CGA PT Short Term Goal 3 (Week 1): Pt will perform sit<>stand with min assist using LRAD PT Short Term Goal 4 (Week 1): Pt will ambulate 22ft using LRAD with mod assist  Skilled Therapeutic Interventions/Progress Updates:  Pt received in bed & agreeable to tx. Pt transfers to sitting EOB & attempts to stand with therapist providing max cuing for NWB RLE & need to don cam boot. Pt dons cam boot with assistance to retreive it. Pt transfers to w/c with supervision via squat pivot & cuing for NWB RLE. Pt dons L shoe & places R ELR on w/c with set up assist. Pt propels w/c around unit with mod I. Pt transfers sit<>stand with CGA & ongoing cuing for NWB RLE. Pt ambulates 25 ft x 2 with RW & CGA with improving ability to clear LLE during hop-to pattern. Pt engages in Wii bowling while standing with RW & CGA with frequent instructional cuing for NWB RLE with task focusing on standing tolerance & balance. Pt transfers w/c<>nu-step mod I via squat pivot. Pt utilizes nu-step on level 2 x 10 minutes with LLE & BUE for global strengthening & endurance training. Back in room pt completes toilet transfer with supervision and grab bar via stand pivot from w/c. Pt with continent void & BM, performing peri hygiene without assistance, and performs hand hygiene at sink from w/c level. At end of session pt transfers w/c>bed via stand pivot with supervision with therapist educating pt on need to perform squat pivot for increased safety; pt left in bed with alarm set & all needs in reach.   Therapy Documentation Precautions:   Precautions Precautions: Fall Precaution Comments: LUE sling for comfort only, LUE ROM as tolerated Required Braces or Orthoses: Sling, Other Brace Other Brace: CAM boot RLE Restrictions Weight Bearing Restrictions: Yes LUE Weight Bearing: Weight bearing as tolerated RLE Weight Bearing: Non weight bearing LLE Weight Bearing: Weight bearing as tolerated  Pain: No c/o pain reported.    Therapy/Group: Individual Therapy  Waunita Schooner 08/16/2018, 9:05 AM

## 2018-08-16 NOTE — Progress Notes (Signed)
Occupational Therapy Session Note  Patient Details  Name: Sandra Montoya MRN: [id number] Date of Birth: 03/29/1960  Today's Date: 08/16/2018 OT Individual Time: 1300-1409 OT Individual Time Calculation (min): 69 min    Short Term Goals: Week 1:  OT Short Term Goal 1 (Week 1): Pt will complete toilet/BSc transfer wiht S OT Short Term Goal 2 (Week 1): Pt will recall hemi dressing technqiues wiht no VC OT Short Term Goal 3 (Week 1): Pt will advance pants past hips with CGA OT Short Term Goal 4 (Week 1): Pt will don cam boot wiht no VC  Skilled Therapeutic Interventions/Progress Updates:    Pt received supine in bed agreeable to therapy. L shoulder pain 7/10 described as "usual pain" no need for intervention. Pt completed sit > stand with CGA. Pt rolled w/c into bathroom and completed toileting tasks with (S). Pt propelled w/c to the therapy gym with (S). Pt stood and completed pipe tree x2 with minor balance perturbations, occasional min A provided. Pt then completed 3x 8 sit <> stands with focus on slow and controlled movements. Cueing for body mechanics. Pt transitioned to supine and completed LUE AROM exercises with light 1 lb resistance using BUE for closed chain support. Pt transitioned back to sitting EOM and transferred back to w/c with mod I. Pt completed tub transfer using TTB to simulate home environment and was able to do so with (S). Reviewed use and safety awareness during showers. Pt returned to her room and completed toileting once more. Pt returned to supine and was left with an ice pack for pain management on her LUE. Bed alarm set.   Therapy Documentation Precautions:  Precautions Precautions: Fall Precaution Comments: LUE sling for comfort only, LUE ROM as tolerated Required Braces or Orthoses: Sling, Other Brace Other Brace: CAM boot RLE Restrictions Weight Bearing Restrictions: Yes LUE Weight Bearing: Weight bearing as tolerated RLE Weight Bearing: Non weight bearing LLE  Weight Bearing: Weight bearing as tolerated Other Position/Activity Restrictions: LUE allowed AROM to 90 degrees and AROM Elbow wrist and hand  Therapy/Group: Individual Therapy  Curtis Sites 08/16/2018, 7:12 AM
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
## 2018-08-16 NOTE — Progress Notes (Signed)
Social Work Patient ID: Sandra Montoya, female   DOB: 13-May-1960, 58 y.o.   MRN: [id number]  Have reviewed team conference with pt who is aware and agreeable with d/c date of 8/1 and supervision/ CGA - mod independent goals.  Have left VM for daughter and will schedule family ed prior to d/c.  Langdon Crosson, LCSW
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
## 2018-08-17 ENCOUNTER — Inpatient Hospital Stay (HOSPITAL_COMMUNITY): Payer: Self-pay

## 2018-08-17 LAB — BASIC METABOLIC PANEL
Anion gap: 9 (ref 5–15)
BUN: 23 mg/dL — ABNORMAL HIGH (ref 6–20)
CO2: 24 mmol/L (ref 22–32)
Calcium: 9.3 mg/dL (ref 8.9–10.3)
Chloride: 101 mmol/L (ref 98–111)
Creatinine, Ser: 0.79 mg/dL (ref 0.44–1.00)
GFR calc Af Amer: >60 mL/min (ref >60)
GFR calc non Af Amer: >60 mL/min (ref >60)
Glucose, Bld: 100 mg/dL — ABNORMAL HIGH (ref 70–99)
Potassium: 4.1 mmol/L (ref 3.5–5.1)
Sodium: 134 mmol/L — ABNORMAL LOW (ref 135–145)

## 2018-08-17 LAB — CBC
HCT: 26.5 % — ABNORMAL LOW (ref 36.0–46.0)
Hemoglobin: 8.5 g/dL — ABNORMAL LOW (ref 12.0–15.0)
MCH: 31.5 pg (ref 26.0–34.0)
MCHC: 32.1 g/dL (ref 30.0–36.0)
MCV: 98.1 fL (ref 80.0–100.0)
Platelets: 664 10*3/uL — ABNORMAL HIGH (ref 150–400)
RBC: 2.7 MIL/uL — ABNORMAL LOW (ref 3.87–5.11)
RDW: 13.2 % (ref 11.5–15.5)
WBC: 10.2 10*3/uL (ref 4.0–10.5)
nRBC: 0.2 % (ref 0.0–0.2)
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
## 2018-08-17 NOTE — Progress Notes (Signed)
Physical Therapy Session Note  Patient Details  Name: Sandra Montoya MRN: [id number] Date of Birth: 1960/05/05  Today's Date: 08/17/2018 PT Individual Time: 1130-1205 PT Individual Time Calculation (min): 35 min   Short Term Goals: Week 1:  PT Short Term Goal 1 (Week 1): Pt will perform supine<>sit with CGA PT Short Term Goal 2 (Week 1): Pt will perform bed<>chair transfers with CGA PT Short Term Goal 3 (Week 1): Pt will perform sit<>stand with min assist using LRAD PT Short Term Goal 4 (Week 1): Pt will ambulate 51ft using LRAD with mod assist  Skilled Therapeutic Interventions/Progress Updates:  Pt resting in w/c.  Pain rated 7/10 L shoulder, R foot, premedicated.  Pt able to state WB precautions accurately .Sit> stand at sink with supervision without cues.  Stand> sit with min assist to prevent wt bearing RLE 2/5 trials.  Therapeutic exercise performed with RLE to increase strength for functional mobility: standing- 10 x 1 R hip flexion, hamstring curls; 10 x 2 R hip abduction (L foot on 1" high pad),  R isolated hip extension with flexed knee (held up by PT).  Seated: 10 x 1 R short arc quad knee extension.  With CAM boot removed, 15 x 1 R toe extension, minimal excursion ankle DF.  Sit> stand in BR using wall bar, supervision for toilet transfer, clothing mgt and peri care.  Pt transferred to bed with supervision, using bed rail.  Sit> supine with supervision.  At end of session, pt resting in bed with RLE elevated, bed alarm set and needs at hand.     Therapy Documentation Precautions:  Precautions Precautions: Fall Precaution Comments: LUE sling for comfort only, LUE ROM as tolerated Required Braces or Orthoses: Sling, Other Brace Other Brace: CAM boot RLE Restrictions Weight Bearing Restrictions: Yes LUE Weight Bearing: Weight bearing as tolerated RLE Weight Bearing: Non weight bearing LLE Weight Bearing: Weight bearing as tolerated Other Position/Activity Restrictions:  LUE allowed AROM to 90 degrees and AROM Elbow wrist and hand        Therapy/Group: Individual Therapy  Trista Ciocca 08/17/2018, 12:11 PM

## 2018-08-17 NOTE — Progress Notes (Signed)
Occupational Therapy Session Note  Patient Details  Name: Sandra Montoya MRN: [id number] Date of Birth: [birth year]  Today's Date: 08/17/2018 OT Individual Time: 5638-7564 OT Individual Time Calculation (min): 70 min    Short Term Goals: Week 1:  OT Short Term Goal 1 (Week 1): Pt will complete toilet/BSc transfer wiht S OT Short Term Goal 2 (Week 1): Pt will recall hemi dressing technqiues wiht no VC OT Short Term Goal 3 (Week 1): Pt will advance pants past hips with CGA OT Short Term Goal 4 (Week 1): Pt will don cam boot wiht no VC  Skilled Therapeutic Interventions/Progress Updates:    1;1. Pt received in bed agreeable to shower. Pt completes supine>sitting with MOD I and OT applies occlusives to heel and shoulder incision. Pt transfers via squat pivot EOB<>w/c<>shower chair with set up. Pt completes bathing seated with supervision. Pt dresses sit to stand with set up with CAM boot donned prior to sit to stand for advancing pants past hips. Pt completes grooming seated at sink with MOD I. OT and pt discuss family ed and pt reports daughter may be coming in today, however this OT has not heard that from [place]. Pt completes supervision squat pivot as pt requires VC for locking B brakes and toileting with set up using grab bar. Pt completes standing balance activities in tx gym (jenga and graded pipe tree) for standing tolerance, balance and adherence to precuations. Pt requires min VC for keeping 1UE on RW during activites to not stand on one foot. Pt completes toileting second time, however with RW as pt will not have grab bar at home with supervision for transfer and clothing management. Exited session with pt seated in w/c, call light in reach exit alarm on, call light in reach and ice provided for shoulder pain (not rated)  Therapy Documentation Precautions:  Precautions Precautions: Fall Precaution Comments: LUE sling for comfort only, LUE ROM as tolerated Required Braces or Orthoses: Sling,  Other Brace Other Brace: CAM boot RLE Restrictions Weight Bearing Restrictions: Yes LUE Weight Bearing: Weight bearing as tolerated RLE Weight Bearing: Non weight bearing LLE Weight Bearing: Weight bearing as tolerated Other Position/Activity Restrictions: LUE allowed AROM to 90 degrees and AROM Elbow wrist and hand General:   Vital Signs:   Pain: Pain Assessment Pain Scale: 0-10 Pain Score: 0-No pain Pain Type: Acute pain;Surgical pain Pain Location: Foot Pain Orientation: Right Pain Descriptors / Indicators: Discomfort;Nagging Pain Frequency: Intermittent Pain Onset: On-going Patients Stated Pain Goal: 0 Pain Intervention(s): Medication (See eMAR) ADL:   Vision   Perception    Praxis   Exercises:   Other Treatments:     Therapy/Group: Individual Therapy  Tonny Branch 08/17/2018, 9:03 AM

## 2018-08-18 ENCOUNTER — Inpatient Hospital Stay (HOSPITAL_COMMUNITY): Payer: Self-pay

## 2018-08-18 ENCOUNTER — Inpatient Hospital Stay (HOSPITAL_COMMUNITY): Payer: Self-pay | Admitting: Physical Therapy

## 2018-08-18 DIAGNOSIS — R7989 Other specified abnormal findings of blood chemistry: Secondary | ICD-10-CM

## 2018-08-18 DIAGNOSIS — K5900 Constipation, unspecified: Secondary | ICD-10-CM

## 2018-08-18 DIAGNOSIS — E559 Vitamin D deficiency, unspecified: Secondary | ICD-10-CM

## 2018-08-18 DIAGNOSIS — I10 Essential (primary) hypertension: Secondary | ICD-10-CM

## 2018-08-18 DIAGNOSIS — D62 Acute posthemorrhagic anemia: Secondary | ICD-10-CM

## 2018-08-18 DIAGNOSIS — D72829 Elevated white blood cell count, unspecified: Secondary | ICD-10-CM

## 2018-08-18 MED ORDER — GABAPENTIN 300 MG PO CAPS: 300.0000 mg | ORAL_CAPSULE | Freq: Three times a day (TID) | ORAL | 1 refills | Status: AC

## 2018-08-18 MED ORDER — ACETAMINOPHEN 325 MG PO TABS: 650.0000 mg | ORAL_TABLET | Freq: Four times a day (QID) | ORAL | Status: DC

## 2018-08-18 MED ORDER — VITAMIN D3 25 MCG PO TABS: 2000.0000 [IU] | ORAL_TABLET | Freq: Two times a day (BID) | ORAL | 1 refills | Status: DC

## 2018-08-18 MED ORDER — FOLIC ACID 1 MG PO TABS: 1.0000 mg | ORAL_TABLET | Freq: Every day | ORAL | 1 refills | Status: DC

## 2018-08-18 MED ORDER — SENNOSIDES-DOCUSATE SODIUM 8.6-50 MG PO TABS: 2.0000 | ORAL_TABLET | Freq: Every day | ORAL | 0 refills | Status: DC

## 2018-08-18 MED ORDER — METHOCARBAMOL 750 MG PO TABS: 750.0000 mg | ORAL_TABLET | Freq: Three times a day (TID) | ORAL | 0 refills | Status: DC | PRN

## 2018-08-18 MED ORDER — ADULT MULTIVITAMIN W/MINERALS CH: 1.0000 | ORAL_TABLET | Freq: Every day | ORAL | Status: DC

## 2018-08-18 MED ORDER — OXYCODONE HCL 5 MG PO TABS: 5.0000 mg | ORAL_TABLET | Freq: Three times a day (TID) | ORAL | 0 refills | Status: DC | PRN

## 2018-08-18 MED ORDER — POLYSACCHARIDE IRON COMPLEX 150 MG PO CAPS: 150.0000 mg | ORAL_CAPSULE | Freq: Every day | ORAL | 1 refills | Status: DC

## 2018-08-18 MED ORDER — ENOXAPARIN SODIUM 40 MG/0.4ML ~~LOC~~ SOLN: 40.0000 mg | SUBCUTANEOUS | 0 refills | Status: DC

## 2018-08-18 MED ORDER — AMLODIPINE BESYLATE 5 MG PO TABS: 5.0000 mg | ORAL_TABLET | Freq: Every day | ORAL | 0 refills | Status: DC

## 2018-08-18 MED ORDER — TRAMADOL HCL 50 MG PO TABS: 50.0000 mg | ORAL_TABLET | Freq: Four times a day (QID) | ORAL | 0 refills | Status: DC | PRN

## 2018-08-18 NOTE — Progress Notes (Signed)
Occupational Therapy Discharge Summary  Patient Details  Name: Sandra Montoya MRN: [id number] Date of Birth: 04-10-1960  Today's Date: 08/18/2018 OT Individual Time: 1300-1345 OT Individual Time Calculation (min): 45 min    Patient has met 8 of 8 long term goals due to improved activity tolerance, improved balance, postural control, ability to compensate for deficits, functional use of  RIGHT lower and LEFT upper extremity, improved awareness and improved coordination.  Patient to discharge at overall Supervision/modified independent level.  Patient's care partner is independent to provide the necessary physical assistance at discharge.    Reasons goals not met: All treatment goals met.   Recommendation:  Patient will benefit from ongoing skilled OT services in home health setting to continue to advance functional skills in the area of BADL and iADL.  Equipment: TTB and BSC  Reasons for discharge: treatment goals met and discharge from hospital  Patient/family agrees with progress made and goals achieved: Yes   Skilled OT Intervention:  Pt received sitting EOB eager for shower. Pt completed functional mobility with RW into bathroom with (S). She transferred onto shower chair and her RLE was occluded. Pt completed all bathing from shower chair with mod I. Pt completed transfer out of shower to w/c with (S). Pt dressed in w/c, mod I sit <> stand. Reviewed occlusion for shower at home and safety. Pt completed grooming tasks at sink with mod I. Pt returned to bed and was left with all needs met.   OT Discharge Precautions/Restrictions  Precautions Precautions: Fall Precaution Comments: LUE sling for comfort only, LUE ROM as tolerated Required Braces or Orthoses: Sling;Other Brace Other Brace: CAM boot RLE Restrictions Weight Bearing Restrictions: Yes LUE Weight Bearing: Weight bearing as tolerated RLE Weight Bearing: Non weight bearing LLE Weight Bearing: Weight bearing as  tolerated Other Position/Activity Restrictions: LUE allowed AROM to 90 degrees and AROM Elbow wrist and hand Pain Pain Assessment Pain Scale: 0-10 Pain Score: 7  Pain Type: Acute pain Pain Location: Shoulder Pain Orientation: Left Pain Radiating Towards: right heel  Pain Descriptors / Indicators: Aching Pain Frequency: Intermittent Pain Onset: On-going Patients Stated Pain Goal: 0 Pain Intervention(s): Rest   Vision Baseline Vision/History: No visual deficits Vision Assessment?: No apparent visual deficits Perception  Perception: Within Functional Limits Praxis Praxis: Intact Cognition Overall Cognitive Status: Within Functional Limits for tasks assessed Arousal/Alertness: Awake/alert Orientation Level: Oriented X4 Attention: Selective Focused Attention: Appears intact Sustained Attention: Appears intact Selective Attention: Appears intact Memory: Appears intact Awareness: Appears intact Problem Solving: Appears intact Safety/Judgment: Appears intact Sensation Sensation Peripheral sensation comments: pt reports R LE light touch diminished compared to L LE Light Touch Impaired Details: Impaired RLE Hot/Cold: Appears Intact Coordination Gross Motor Movements are Fluid and Coordinated: No Fine Motor Movements are Fluid and Coordinated: Yes Coordination and Movement Description: impaired due to pain and weakness Finger Nose Finger Test: [facility] Motor  Motor Motor: Other (comment) Motor - Discharge Observations: limited by NWB status Mobility  Bed Mobility Bed Mobility: Supine to Sit Supine to Sit: Independent Sit to Supine: Independent Transfers Sit to Stand: Independent with assistive device Stand to Sit: Independent with assistive device  Trunk/Postural Assessment  Cervical Assessment Cervical Assessment: Within Functional Limits Thoracic Assessment Thoracic Assessment: Within Functional Limits Lumbar Assessment Lumbar Assessment: Within Functional  Limits Postural Control Postural Control: Deficits on evaluation Righting Reactions: delayed  Balance Balance Balance Assessed: Yes Static Sitting Balance Static Sitting - Balance Support: Feet supported Static Sitting - Level of Assistance: 6: Modified independent (Device/Increase  time) Dynamic Sitting Balance Dynamic Sitting - Balance Support: Feet supported Dynamic Sitting - Level of Assistance: 6: Modified independent (Device/Increase time) Static Standing Balance Static Standing - Balance Support: During functional activity;Bilateral upper extremity supported Static Standing - Level of Assistance: 5: Stand by assistance Dynamic Standing Balance Dynamic Standing - Balance Support: During functional activity;Bilateral upper extremity supported Dynamic Standing - Level of Assistance: 5: Stand by assistance Dynamic Standing - Comments: (S) required during hopping for safety Extremity/Trunk Assessment RUE Assessment RUE Assessment: Within Functional Limits LUE Assessment Active Range of Motion (AROM) Comments: LUE can reach 110 degrees of shoulder flexion, all other movement WNL General Strength Comments: No formal MMT, grasp 4/5   Curtis Sites OTR/L  08/18/2018, 8:11 AM

## 2018-08-18 NOTE — Progress Notes (Signed)
Social Work Discharge Note   The overall goal for the admission was met for: DC SAT 8/1  Discharge location: [place]  Length of Stay: Yes-10 DAYS  Discharge activity level: Yes-SUPERVISION-MIN WHEELCHAIR LEVEL  Home/community participation: Yes  Services provided included: MD, RD, PT, OT, RN, CM, Pharmacy, Neuropsych and SW  Financial Services: Other: PENDING MEDICAID  Follow-up services arranged: Home Health: [facility]-PT, OT,RN, DME: [facility]-WHEELCHAIR, ROLLING WALKER, 3 IN 1, [facility] and Patient/Family has no preference for HH/DME agencies  Comments (or additional information):DAUGHTER WAS HERE TO GO THROUGH THERAPIES AND FEELS COMFORTABLE WITH DC AND GOING HOME TOMORROW.  Patient/Family verbalized understanding of follow-up arrangements: Yes  Individual responsible for coordination of the follow-up plan: SELF & DAUGHTER  Confirmed correct DME delivered: Elease Hashimoto 08/18/2018    Elease Hashimoto
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
## 2018-08-18 NOTE — Progress Notes (Signed)
Physical Therapy Discharge Summary  Patient Details  Name: Sandra Montoya MRN: [id number] Date of Birth: 23-Jul-1960  Today's Date: 08/18/2018 PT Individual Time: 1005-1100 PT Individual Time Calculation (min): 55 min    Patient has met 8 of 8 long term goals due to improved activity tolerance, improved balance, improved postural control, increased strength, ability to compensate for deficits, functional use of  right upper extremity, left upper extremity and left lower extremity and improved awareness.  Patient to discharge at w/c level mod I for transfers, CGA for gait with RW level.   Patient's care partner is independent to provide the necessary physical assistance at discharge.  Reasons goals not met: n/a  Recommendation:  Patient will benefit from ongoing skilled PT services in home health setting to continue to advance safe functional mobility, address ongoing impairments in gait with LRAD, stair negotiation, strengthening, balance, transfers, and minimize fall risk.  Equipment: 16x16 w/c with ELR, RW  Reasons for discharge: treatment goals met  Patient/family agrees with progress made and goals achieved: Yes  Skilled PT Treatment: Pt received in room with daughter present for caregiver training. No c/o pain reported during session. Educated them on NWB RLE with need to wear cam boot when OOB, need to primarily use w/c for mobility, squat pivot transfers only with mod I, & need for CGA for gait with RW. Pt, caregiver, and PT practiced bumping pt up/down steps in w/c with caregiver return demonstrating ability to set up w/c and assist with bumping pt up/down steps. Pt ambulates 10 ft with CGA with PT then 10 ft with CGA from daughter with daughter able to instruct pt in NWB RLE with fair return demo from pt. Pt completes car transfer at sedan simulated height with RW & CGA from daughter, and squat pivot w/c<>couch with mod I. Discussed showering at home and need for pt to cover RLE with bag  & tape (per OT) and pt performed TTB transfer with cuing for technique. Discussed use of BSC at night 2/2 pt's frequent urination & using toilet during the day. Educated them on f/u HHPT & OT. Pt propels w/c around unit with mod I and transfers to bed with mod I. Pt performed RLE HEP with only occasional cuing for technique and pt demonstrating understanding of all exercises. Pt left in bed with daughter present to supervise & bed alarm set.   All questions/concerns answered to pt & daughter's satisfaction.   No c/o pain reported during session.  PT Discharge Precautions/Restrictions Precautions Precautions: Fall Precaution Comments: LUE sling for comfort only, LUE ROM as tolerated Required Braces or Orthoses: Sling;Other Brace Other Brace: CAM boot RLE Restrictions Weight Bearing Restrictions: Yes LUE Weight Bearing: Weight bearing as tolerated RLE Weight Bearing: Non weight bearing LLE Weight Bearing: Weight bearing as tolerated  Vision/Perception  Pt reports wearing glasses at all times at baseline. Pt denies changes in baseline vision. Perception WNL.  Cognition Overall Cognitive Status: Within Functional Limits for tasks assessed Arousal/Alertness: Awake/alert Orientation Level: Oriented X4 Focused Attention: Appears intact Sustained Attention: Appears intact Selective Attention: Appears intact Awareness: Appears intact Problem Solving: Appears intact Safety/Judgment: Appears intact  Sensation Sensation Light Touch: Not tested Proprioception: Not tested Coordination Gross Motor Movements are Fluid and Coordinated: No(limited in RLE, LUE by weakness) Fine Motor Movements are Fluid and Coordinated: Yes Coordination and Movement Description: impaired due to pain and weakness   Motor  Motor Motor: Abnormal postural alignment and control Motor - Skilled Clinical Observations: generalized weakness Motor - Discharge  Observations: generalized deconditioning, weakness    Mobility Bed Mobility Bed Mobility: Supine to Sit;Rolling Left;Rolling Right;Sit to Supine Rolling Right: Independent Rolling Left: Independent Supine to Sit: Independent Sit to Supine: Independent Transfers Transfers: Stand to Sit;Sit to [employer] Transfers Sit to Stand: Contact Guard/Touching assist Stand to Sit: Contact Guard/Touching assist Squat Pivot Transfers: Independent with assistive device(w/c<>bed with armrest)  Locomotion  Gait Ambulation: Yes Gait Assistance: Contact Guard/Touching assist Gait Distance (Feet): 20 Feet Assistive device: Rolling walker Gait Assistance Details: verbal cuing for NWB RLE Gait Gait: Yes Gait Pattern: (hop to pattern with LLE, decreased LLE foot clearance, decreased ability to maintain NWB RLE) Gait velocity: decreased Stairs / Additional Locomotion Stairs: No(caregiver demonstrates ability to bump pt up/down steps in w/c) Ramp: Supervision/Verbal cueing(w/c level) Product manager Mobility: Yes Wheelchair Assistance: Independent with assistive device Wheelchair Propulsion: Both upper extremities;Left lower extremity Wheelchair Parts Management: Independent Distance: >150 ft   Trunk/Postural Assessment  Cervical Assessment Cervical Assessment: Within Functional Limits Thoracic Assessment Thoracic Assessment: Within Functional Limits Lumbar Assessment Lumbar Assessment: Within Functional Limits Postural Control Postural Control: Deficits on evaluation Righting Reactions: delayed Protective Responses: delayed   Balance Balance Balance Assessed: Yes Static Sitting Balance Static Sitting - Balance Support: Feet supported Static Sitting - Level of Assistance: 6: Modified independent (Device/Increase time) Dynamic Standing Balance Dynamic Standing - Balance Support: Bilateral upper extremity supported Dynamic Standing - Level of Assistance: (CGA) Dynamic Standing - Balance Activities: (gait with  RW)  Extremity Assessment  RUE Assessment RUE Assessment: Within Functional Limits LUE (Per OT report) AROM: can reach 110 degrees shoulder flexion, all other movements WNL, no formal MMT, grasp 4/5  LLE WNL RLE: not formally assessed, hip & knee AROM WNL   Waunita Schooner 08/18/2018, 12:32 PM

## 2018-08-18 NOTE — Progress Notes (Signed)
Sandra Montoya PHYSICAL MEDICINE & REHABILITATION PROGRESS NOTE   Subjective/Complaints: Pt feeling well. No new complaints. Excited about getting home  ROS: Patient denies fever, rash, sore throat, blurred vision, nausea, vomiting, diarrhea, cough, shortness of breath or chest pain, joint or back pain, headache, or mood change.    Objective:   No results found. Recent Labs    08/17/18 0509  WBC 10.2  HGB 8.5*  HCT 26.5*  PLT 664*   Recent Labs    08/17/18 0509  NA 134*  K 4.1  CL 101  CO2 24  GLUCOSE 100*  BUN 23*  CREATININE 0.79  CALCIUM 9.3    Intake/Output Summary (Last 24 hours) at 08/18/2018 1000 Last data filed at 08/18/2018 0900 Gross per 24 hour  Intake 560 ml  Output -  Net 560 ml     Physical Exam: Vital Signs Blood pressure (!) 145/87, pulse 89, temperature 98.3 F (36.8 C), resp. rate 18, height 5\' 5"  (1.651 m), weight 68.3 kg, SpO2 99 %. Constitutional: No distress . Vital signs reviewed. HEENT: EOMI, oral membranes moist Neck: supple Cardiovascular: RRR without murmur. No JVD    Respiratory: CTA Bilaterally without wheezes or rales. Normal effort    GI: BS +, non-tender, non-distended  Extremities: No clubbing, cyanosis, or edema Skin: No evidence of breakdown, no evidence of rash steristrips left shoulder, right foot wound clean. Still sl drainage, sutures intact.  Neurologic: engages, fair insight Language normal, clear. motor strength is 5/5 in right and 2- Left deltoid pain,5/5 R and 2- bicep, 3 tricep limited by pain.,5/5 bilateral  Grip, 3/5 hip flexors, knee extensors, Left ankle dorsiflexor and plantar flexor 4/5. Right ankle limited.  No obvious sensory deficits.  Musc: lmiited left shoulder P and AROM d/t pain Psych: pleasant   Assessment/Plan: 1. 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  Physiatrist is providing close  team supervision and 24 hour management of active medical problems listed below.  Physiatrist and rehab team continue to assess barriers to discharge/monitor patient progress toward functional and medical goals  Care Tool:  Bathing    Body parts bathed by patient: Right arm, Left arm, Chest, Abdomen, Front perineal area, Buttocks, Right upper leg, Left upper leg, Right lower leg, Left lower leg, Face         Bathing assist Assist Level: Supervision/Verbal cueing     Upper Body Dressing/Undressing Upper body dressing   What is the patient wearing?: Pull over shirt    Upper body assist Assist Level: Set up assist    Lower Body Dressing/Undressing Lower body dressing      What is the patient wearing?: Pants     Lower body assist Assist for lower body dressing: Contact Guard/Touching assist(in standing for balance requires CGA)     Toileting Toileting    Toileting assist Assist for toileting: Contact Guard/Touching assist     Transfers Chair/bed transfer  Transfers assist     Chair/bed transfer assist level: Supervision/Verbal cueing     Locomotion Ambulation   Ambulation assist      Assist level: Contact Guard/Touching assist Assistive device: Walker-rolling Max distance: 20'   Walk 10 feet activity   Assist  Walk 10 feet activity did not occur: Safety/medical concerns  Assist level: Contact Guard/Touching assist Assistive device: Walker-rolling   Walk 50 feet activity   Assist Walk 50 feet with 2 turns activity did not occur: Safety/medical  concerns         Walk 150 feet activity   Assist Walk 150 feet activity did not occur: Safety/medical concerns         Walk 10 feet on uneven surface  activity   Assist Walk 10 feet on uneven surfaces activity did not occur: Safety/medical concerns         Wheelchair     Assist Will patient use wheelchair at discharge?: Yes Type of Wheelchair: Manual    Wheelchair assist level:  Independent Max wheelchair distance: 300'    Wheelchair 50 feet with 2 turns activity    Assist        Assist Level: Independent   Wheelchair 150 feet activity     Assist Wheelchair 150 feet activity did not occur: Safety/medical concerns   Assist Level: Independent   Medical Problem List and Plan: 1.Functional deficitssecondary to TBI andmultitrauma (Left 1st and 2nd rib fx, L humeral fx, comminuted L inf pubic ramus fx, comminuted R calcaneal fx)  -Continue CIR therapies including PT, OT   -ELOS 8/1  -Patient to see Rehab MD/provider in the office for transitional care encounter in 1-2 weeks.      2. Antithrombotics: -DVT/anticoagulation:Pharmaceutical:Lovenox -antiplatelet therapy: N/A 3. Pain Management:Continue oxycodone prn. On tylenol and gabapentin tid.  -fair control at present 4. Mood:LCSW to follow for evaluation and support. -antipsychotic agents: N/A  -appreciate NPsych assessment 5. Neuropsych: This patientiscapable of making decisions on herown behalf. 6. Skin/Wound Care:continue foam dressing on heel, remove sutures as outpt 7. Fluids/Electrolytes/Nutrition:    -BUN sl improved to 23  -protein supp for low albumin.   -continue to encourage fluids 8. HTN: Has been labile--was on lisinopril and Norvasc PTA. Will monitor BP tid with orthostatic vitals.  -Resumed low dose norvasc 5mg   With improvement 9. H/o hypokalemia: Was on supplement at home--resumed.  10. ABLA: I .    -hgb back down to 8.5 7/30, dilutional somewhat  -continue fe++ supp.  11. Vitamin D deficiency: Now on supplement.  12. Low grade fevers: Encourage IS and activity out of bed.  13. Constipation:  Senna-s bid  -BM 7/29 14. GERD: Resumed PPI    -keep HOB when possible     LOS: 9 days A FACE TO FACE EVALUATION WAS PERFORMED  Meredith Staggers 08/18/2018, 10:00 AM

## 2018-08-18 NOTE — Progress Notes (Signed)
Occupational Therapy Session Note  Patient Details  Name: Sandra Montoya MRN: [id number] Date of Birth: [birth year]  Today's Date: 08/18/2018 OT Individual Time: 6834-1962 OT Individual Time Calculation (min): 75 min    Short Term Goals: Week 1:  OT Short Term Goal 1 (Week 1): Pt will complete toilet/BSc transfer wiht S OT Short Term Goal 2 (Week 1): Pt will recall hemi dressing technqiues wiht no VC OT Short Term Goal 3 (Week 1): Pt will advance pants past hips with CGA OT Short Term Goal 4 (Week 1): Pt will don cam boot wiht no VC  Skilled Therapeutic Interventions/Progress Updates:    Pt received supine c/o pain 7-8 in L shoulder and foot, no request for intervention. Pt completed bed mobility with mod I to EOB. Pt transferred to w/c, squat pivot with mod I. Pt is demonstrating improved safety with w/c use and set up. Pt completed squat pivot transfer to [facility] over toilet with mod I, use of grab bar. Discussed showering at home, including safety and how to cover necessary incisions. Pt sat at sink and completed oral care and grooming. Pt propelled w/c to the therapy gym with mod I. Pt used RW to complete functional mobility (hopping) to mat, 30 ft to simulate household distances in the event w/c will not fit through doorway (pt still unsure). Pt requiring no more than close (S) for safety. Pt stood at completed dynamic throwing/catching task to increase dynamic standing balance on LLE only. No LOB, (S) provided. Pt then completed functional R LE lifting to challenge balance in standing with bilateral and unilateral UE support. Pt stood at parallel bars and completed RLE abduction, adduction, and extension exercises with no resistance 2x 10. Pt then sat EOM and completed BUE closed chain lifting with no resistance. Also completed gentle LUE ER/IR to reduce risk of tightness. Pt used 3 lb dumbbell in her R UE to strength triceps and biceps. Pt propelled her w/c back to the room and completed toileting  tasks once more with mod I. Pt was left supine with all needs met.   Therapy Documentation Precautions:  Precautions Precautions: Fall Precaution Comments: LUE sling for comfort only, LUE ROM as tolerated Required Braces or Orthoses: Sling, Other Brace Other Brace: CAM boot RLE Restrictions Weight Bearing Restrictions: Yes LUE Weight Bearing: Weight bearing as tolerated RLE Weight Bearing: Non weight bearing LLE Weight Bearing: Weight bearing as tolerated Other Position/Activity Restrictions: LUE allowed AROM to 90 degrees and AROM Elbow wrist and hand   Therapy/Group: Individual Therapy  Curtis Sites 08/18/2018, 7:09 AM

## 2018-08-19 DIAGNOSIS — I10 Essential (primary) hypertension: Secondary | ICD-10-CM

## 2018-08-19 NOTE — Progress Notes (Signed)
Patient discharged to home accompanied by daughter. Discharge instructions given to patient and daughter on 08/18/2018 by [facility]. All belongings sent home with patient. No questions or concerns noted.

## 2018-08-19 NOTE — Progress Notes (Signed)
[place] PHYSICAL MEDICINE & REHABILITATION PROGRESS NOTE   Subjective/Complaints: Patient seen sitting up in bed this morning.  She states she slept well overnight.  She states she is ready for discharge.  ROS: Denies CP, SOB, N/V/D  Objective:   No results found. Recent Labs    08/17/18 0509  WBC 10.2  HGB 8.5*  HCT 26.5*  PLT 664*   Recent Labs    08/17/18 0509  NA 134*  K 4.1  CL 101  CO2 24  GLUCOSE 100*  BUN 23*  CREATININE 0.79  CALCIUM 9.3    Intake/Output Summary (Last 24 hours) at 08/19/2018 1308 Last data filed at 08/18/2018 1851 Gross per 24 hour  Intake 240 ml  Output -  Net 240 ml     Physical Exam: Vital Signs Blood pressure 124/63, pulse 85, temperature 98.7 F (37.1 C), temperature source Oral, resp. rate 16, height 5\' 5"  (1.651 m), weight 68.3 kg, SpO2 100 %. Constitutional: No distress . Vital signs reviewed. HENT: Normocephalic.  Atraumatic. Eyes: EOMI. No discharge. Cardiovascular: No JVD. Respiratory: Normal effort. GI: Non-distended. Musc: Pain and tenderness Skin: Warm and dry.  Intact. Neurologic: Alert Bilateral lower extremities: Grossly 4/5 Psych: Normal mood.  Normal affect.   Assessment/Plan: 1. 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  Physiatrist is providing close team supervision and 24 hour management of active medical problems listed below.  Physiatrist and rehab team continue to assess barriers to discharge/monitor patient progress toward functional and medical goals  Care Tool:  Bathing    Body parts bathed by patient: Right arm, Left arm, Chest, Abdomen, Front perineal area, Buttocks, Right upper leg, Left upper leg, Right lower leg, Left lower leg, Face         Bathing assist Assist Level: Independent with assistive device     Upper Body Dressing/Undressing Upper body dressing   What is  the patient wearing?: Pull over shirt    Upper body assist Assist Level: Independent    Lower Body Dressing/Undressing Lower body dressing      What is the patient wearing?: Pants     Lower body assist Assist for lower body dressing: Independent     Toileting Toileting    Toileting assist Assist for toileting: Independent with assistive device     Transfers Chair/bed transfer  Transfers assist     Chair/bed transfer assist level: Independent with assistive device Chair/bed transfer assistive device: Armrests   Locomotion Ambulation   Ambulation assist      Assist level: Contact Guard/Touching assist Assistive device: Walker-rolling Max distance: 20'   Walk 10 feet activity   Assist  Walk 10 feet activity did not occur: Safety/medical concerns  Assist level: Contact Guard/Touching assist Assistive device: Walker-rolling   Walk 50 feet activity   Assist Walk 50 feet with 2 turns activity did not occur: Safety/medical concerns         Walk 150 feet activity   Assist Walk 150 feet activity did not occur: Safety/medical concerns         Walk 10 feet on uneven surface  activity   Assist Walk 10 feet on uneven surfaces activity did not occur: Safety/medical concerns         Wheelchair     Assist Will patient use wheelchair at discharge?: Yes Type of Wheelchair: Manual    Wheelchair assist level: Independent Max wheelchair distance: 300'  Wheelchair 50 feet with 2 turns activity    Assist        Assist Level: Independent   Wheelchair 150 feet activity     Assist Wheelchair 150 feet activity did not occur: Safety/medical concerns   Assist Level: Independent   Medical Problem List and Plan: 1.Functional deficitssecondary to TBI andmultitrauma (Left 1st and 2nd rib fx, L humeral fx, comminuted L inf pubic ramus fx, comminuted R calcaneal fx)  DC today  -Patient to see Rehab MD/provider in the office for  transitional care encounter in 1-2 weeks. 2. Antithrombotics: -DVT/anticoagulation:Pharmaceutical:Lovenox -antiplatelet therapy: N/A 3. Pain Management:Continue oxycodone prn. On tylenol and gabapentin tid.  Stable on medications 4. Mood:LCSW to follow for evaluation and support. -antipsychotic agents: N/A  -appreciate NPsych assessment 5. Neuropsych: This patientiscapable of making decisions on herown behalf. 6. Skin/Wound Care:continue foam dressing on heel, remove sutures as outpt 7. Fluids/Electrolytes/Nutrition:    -BUN sl improved to 23  -protein supp for low albumin.   -continue to encourage fluids 8. HTN: Has been labile--was on lisinopril and Norvasc PTA. Will monitor BP tid with orthostatic vitals.  -Resumed low dose norvasc 5mg   Relatively controlled on 8/1 9. H/o hypokalemia: Was on supplement at home--resumed.  10. ABLA:     Hemoglobin 8.5 on 7/30, follow-up as outpatient  -continue fe++ supp.  11. Vitamin D deficiency: Now on supplement.  12. Low grade fevers: Encourage IS and activity out of bed.  13. Constipation:  Senna-s bid 14. GERD: Resumed PPI    -keep HOB when possible  LOS: 10 days A FACE TO FACE EVALUATION WAS PERFORMED  Jessicamarie Amiri Lorie Phenix 08/19/2018, 1:08 PM

## 2018-08-22 ENCOUNTER — Inpatient Hospital Stay (HOSPITAL_COMMUNITY): Payer: Self-pay | Admitting: Rehabilitation

## 2018-08-29 ENCOUNTER — Other Ambulatory Visit: Payer: Self-pay

## 2018-08-29 ENCOUNTER — Encounter: Payer: Self-pay | Attending: Registered Nurse | Admitting: Registered Nurse

## 2018-08-29 ENCOUNTER — Encounter: Payer: Self-pay | Admitting: Registered Nurse

## 2018-08-29 VITALS — BP 142/76 | Temp 97.9°F | Resp 16

## 2018-08-29 DIAGNOSIS — F17210 Nicotine dependence, cigarettes, uncomplicated: Secondary | ICD-10-CM | POA: Insufficient documentation

## 2018-08-29 DIAGNOSIS — J439 Emphysema, unspecified: Secondary | ICD-10-CM | POA: Insufficient documentation

## 2018-08-29 DIAGNOSIS — T1490XA Injury, unspecified, initial encounter: Secondary | ICD-10-CM

## 2018-08-29 DIAGNOSIS — S32592A Other specified fracture of left pubis, initial encounter for closed fracture: Secondary | ICD-10-CM | POA: Insufficient documentation

## 2018-08-29 DIAGNOSIS — K5730 Diverticulosis of large intestine without perforation or abscess without bleeding: Secondary | ICD-10-CM | POA: Insufficient documentation

## 2018-08-29 DIAGNOSIS — K760 Fatty (change of) liver, not elsewhere classified: Secondary | ICD-10-CM | POA: Insufficient documentation

## 2018-08-29 DIAGNOSIS — I700 Atherosclerosis of aorta: Secondary | ICD-10-CM | POA: Insufficient documentation

## 2018-08-29 DIAGNOSIS — G8918 Other acute postprocedural pain: Secondary | ICD-10-CM

## 2018-08-29 DIAGNOSIS — S42022A Displaced fracture of shaft of left clavicle, initial encounter for closed fracture: Secondary | ICD-10-CM | POA: Insufficient documentation

## 2018-08-29 DIAGNOSIS — R262 Difficulty in walking, not elsewhere classified: Secondary | ICD-10-CM | POA: Insufficient documentation

## 2018-08-29 DIAGNOSIS — I10 Essential (primary) hypertension: Secondary | ICD-10-CM

## 2018-08-29 DIAGNOSIS — S2242XA Multiple fractures of ribs, left side, initial encounter for closed fracture: Secondary | ICD-10-CM | POA: Insufficient documentation

## 2018-08-29 DIAGNOSIS — S32592B Other specified fracture of left pubis, initial encounter for open fracture: Secondary | ICD-10-CM

## 2018-08-29 DIAGNOSIS — K449 Diaphragmatic hernia without obstruction or gangrene: Secondary | ICD-10-CM | POA: Insufficient documentation

## 2018-08-29 MED ORDER — TRAMADOL HCL 50 MG PO TABS: 50.0000 mg | ORAL_TABLET | Freq: Three times a day (TID) | ORAL | 0 refills | Status: DC | PRN
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
## 2018-08-31 ENCOUNTER — Telehealth: Payer: Self-pay | Admitting: Registered Nurse

## 2018-08-31 NOTE — Telephone Encounter (Signed)
This provider sent an e-mail to [facility], regarding Ms. [facility]. [facility] didn't have any records of Ms. [facility]. Becky states no Home Health Agencies have any charity slots available. Placed a call to Ms. Creely daughter Ellison Hughs regarding the above, she verbalizes understanding.

## 2018-09-13 ENCOUNTER — Other Ambulatory Visit: Payer: Self-pay | Admitting: Physical Medicine and Rehabilitation

## 2018-09-26 ENCOUNTER — Encounter: Payer: MEDICAID | Attending: Physical Medicine and Rehabilitation | Admitting: Physical Medicine and Rehabilitation

## 2018-09-26 DIAGNOSIS — T1490XA Injury, unspecified, initial encounter: Secondary | ICD-10-CM | POA: Insufficient documentation

## 2018-09-26 DIAGNOSIS — S2242XA Multiple fractures of ribs, left side, initial encounter for closed fracture: Secondary | ICD-10-CM | POA: Insufficient documentation

## 2018-09-26 DIAGNOSIS — K760 Fatty (change of) liver, not elsewhere classified: Secondary | ICD-10-CM | POA: Insufficient documentation

## 2018-09-26 DIAGNOSIS — I10 Essential (primary) hypertension: Secondary | ICD-10-CM | POA: Insufficient documentation

## 2018-09-26 DIAGNOSIS — I700 Atherosclerosis of aorta: Secondary | ICD-10-CM | POA: Insufficient documentation

## 2018-09-26 DIAGNOSIS — K5730 Diverticulosis of large intestine without perforation or abscess without bleeding: Secondary | ICD-10-CM | POA: Insufficient documentation

## 2018-09-26 DIAGNOSIS — J439 Emphysema, unspecified: Secondary | ICD-10-CM | POA: Insufficient documentation

## 2018-09-26 DIAGNOSIS — K449 Diaphragmatic hernia without obstruction or gangrene: Secondary | ICD-10-CM | POA: Insufficient documentation

## 2018-09-26 DIAGNOSIS — G8918 Other acute postprocedural pain: Secondary | ICD-10-CM | POA: Insufficient documentation

## 2018-09-26 DIAGNOSIS — S32592B Other specified fracture of left pubis, initial encounter for open fracture: Secondary | ICD-10-CM | POA: Insufficient documentation

## 2018-09-26 DIAGNOSIS — S42022A Displaced fracture of shaft of left clavicle, initial encounter for closed fracture: Secondary | ICD-10-CM | POA: Insufficient documentation

## 2018-09-26 DIAGNOSIS — F17210 Nicotine dependence, cigarettes, uncomplicated: Secondary | ICD-10-CM | POA: Insufficient documentation

## 2018-09-26 DIAGNOSIS — S32592A Other specified fracture of left pubis, initial encounter for closed fracture: Secondary | ICD-10-CM | POA: Insufficient documentation

## 2018-09-26 DIAGNOSIS — R262 Difficulty in walking, not elsewhere classified: Secondary | ICD-10-CM | POA: Insufficient documentation

## 2018-10-10 ENCOUNTER — Ambulatory Visit: Payer: Self-pay | Admitting: Student

## 2018-10-10 DIAGNOSIS — S92011P Displaced fracture of body of right calcaneus, subsequent encounter for fracture with malunion: Secondary | ICD-10-CM

## 2018-10-22 MED ORDER — INSULIN ASPART 100 UNIT/ML ~~LOC~~ SOLN
SUBCUTANEOUS | Status: AC
  Filled 2018-10-22: qty 1

## 2018-10-23 NOTE — Pre-Procedure Instructions (Signed)
[id number] - [place], [place] - [address] AT [facility] [address] [place] [address] Phone: [phone] Fax: [phone]  [facility] [address] [place], [place] - [address] [place] [address] Phone: [phone] Fax: [phone]      Your procedure is scheduled on  10-25-18 from [id number]  Report to [facility] Main Entrance "A" at 0630 A.M., and check in at the Admitting office.  Call this number if you have problems the morning of surgery:  [phone]  Call [phone] if you have any questions prior to your surgery date Monday-Friday 8am-4pm    Remember:  Do not eat or drink after midnight the night before your surgery  You may drink clear liquids until 0530AM the morning of your surgery.   Clear liquids allowed are: Water, Non-Citrus Juices (without pulp), Carbonated Beverages, Clear Tea, Black Coffee Only, and Gatorade  Please complete your PRE-SURGERY ENSURE that was provided to you by 0530AM  the morning of surgery.  Please, if able, drink it in one setting. DO NOT SIP.   Take these medicines the morning of surgery with A SIP OF WATER : amLODipine (NORVASC) gabapentin (NEURONTIN) acetaminophen (TYLENOL)  omeprazole (PRILOSEC)   7 days prior to surgery STOP taking any Aspirin (unless otherwise instructed by your surgeon), Aleve, Naproxen, Ibuprofen, Motrin, Advil, Goody's, BC's, all herbal medications, fish oil, and all vitamins.    The Morning of Surgery  Do not wear jewelry, make-up or nail polish.  Do not wear lotions, powders, or perfumes, or deodorant  Do not shave 48 hours prior to surgery.    Do not bring valuables to the hospital.  [facility] is not responsible for any belongings or valuables.  If you are a smoker, DO NOT Smoke 24 hours prior to surgery IF you wear a CPAP at night please bring your mask, tubing, and machine the morning of surgery    Remember that you must have someone to transport you home after your surgery, and remain with you for 24 hours if you are discharged the same day.   Contacts, glasses, hearing aids, dentures or bridgework may not be worn into surgery.    Leave your suitcase in the car.  After surgery it may be brought to your room.  For patients admitted to the hospital, discharge time will be determined by your treatment team.  Patients discharged the day of surgery will not be allowed to drive home.    Special instructions:   [place]- Preparing For Surgery  Before surgery, you can play an important role. Because skin is not sterile, your skin needs to be as free of germs as possible. You can reduce the number of germs on your skin by washing with CHG (chlorahexidine gluconate) Soap before surgery.  CHG is an antiseptic cleaner which kills germs and bonds with the skin to continue killing germs even after washing.    Oral Hygiene is also important to reduce your risk of infection.  Remember - BRUSH YOUR TEETH THE MORNING OF SURGERY WITH YOUR REGULAR TOOTHPASTE  Please do not use if you have an allergy to CHG or antibacterial soaps. If your skin becomes reddened/irritated stop using the CHG.  Do not shave (including legs and underarms) for at least 48 hours prior to first CHG shower. It is OK to shave your face.  Please follow these instructions carefully.  1. Shower the NIGHT BEFORE SURGERY and the MORNING OF SURGERY with CHG Soap.   2. If you chose to wash your hair, wash your hair first as usual with your normal shampoo.  3. After you shampoo, rinse your hair and body thoroughly to remove the shampoo.  4. Use CHG as you would any other liquid soap. You can apply CHG directly to the skin and wash gently with a scrungie or a clean washcloth.   5. Apply the CHG Soap to your body ONLY FROM THE NECK DOWN.  Do not use on open wounds or open sores. Avoid contact with your eyes, ears, mouth and  genitals (private parts). Wash Face and genitals (private parts)  with your normal soap.   6. Wash thoroughly, paying special attention to the area where your surgery will be performed.  7. Thoroughly rinse your body with warm water from the neck down.  8. DO NOT shower/wash with your normal soap after using and rinsing off the CHG Soap.  9. Pat yourself dry with a CLEAN TOWEL.  10. Wear CLEAN PAJAMAS to bed the night before surgery, wear comfortable clothes the morning of surgery  11. Place CLEAN SHEETS on your bed the night of your first shower and DO NOT SLEEP WITH PETS.    Day of Surgery:  Do not apply any deodorants/lotions. Please shower the morning of surgery with the CHG soap  Please wear clean clothes to the hospital/surgery center.   Remember to brush your teeth WITH YOUR REGULAR TOOTHPASTE.   Please read over the  fact sheets that you were given.
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
## 2018-10-24 ENCOUNTER — Other Ambulatory Visit (HOSPITAL_COMMUNITY)
Admission: RE | Admit: 2018-10-24 | Discharge: 2018-10-24 | Disposition: A | Discharge status: Home / Self Care | Payer: Medicaid Other | Source: Ambulatory Visit | Attending: Student | Admitting: Student

## 2018-10-24 ENCOUNTER — Encounter (HOSPITAL_COMMUNITY)
Admission: RE | Admit: 2018-10-24 | Discharge: 2018-10-24 | Disposition: A | Discharge status: Home / Self Care | Payer: Medicaid Other | Source: Ambulatory Visit | Attending: Student | Admitting: Student

## 2018-10-24 ENCOUNTER — Other Ambulatory Visit: Payer: Self-pay

## 2018-10-24 ENCOUNTER — Encounter (HOSPITAL_COMMUNITY): Payer: Self-pay

## 2018-10-24 DIAGNOSIS — I10 Essential (primary) hypertension: Secondary | ICD-10-CM | POA: Insufficient documentation

## 2018-10-24 DIAGNOSIS — S92011P Displaced fracture of body of right calcaneus, subsequent encounter for fracture with malunion: Secondary | ICD-10-CM | POA: Insufficient documentation

## 2018-10-24 DIAGNOSIS — E876 Hypokalemia: Secondary | ICD-10-CM | POA: Insufficient documentation

## 2018-10-24 DIAGNOSIS — Z79899 Other long term (current) drug therapy: Secondary | ICD-10-CM | POA: Diagnosis not present

## 2018-10-24 DIAGNOSIS — Z01812 Encounter for preprocedural laboratory examination: Secondary | ICD-10-CM | POA: Insufficient documentation

## 2018-10-24 DIAGNOSIS — Z20828 Contact with and (suspected) exposure to other viral communicable diseases: Secondary | ICD-10-CM | POA: Insufficient documentation

## 2018-10-24 DIAGNOSIS — Z87891 Personal history of nicotine dependence: Secondary | ICD-10-CM | POA: Insufficient documentation

## 2018-10-24 DIAGNOSIS — W19XXXD Unspecified fall, subsequent encounter: Secondary | ICD-10-CM | POA: Diagnosis not present

## 2018-10-24 DIAGNOSIS — Z7901 Long term (current) use of anticoagulants: Secondary | ICD-10-CM | POA: Diagnosis not present

## 2018-10-24 DIAGNOSIS — Y929 Unspecified place or not applicable: Secondary | ICD-10-CM | POA: Diagnosis not present

## 2018-10-24 DIAGNOSIS — Y9389 Activity, other specified: Secondary | ICD-10-CM | POA: Diagnosis not present

## 2018-10-24 HISTORY — DX: Cough, unspecified: R05.9

## 2018-10-24 LAB — CBC
HCT: 35 % — ABNORMAL LOW (ref 36.0–46.0)
Hemoglobin: 10.7 g/dL — ABNORMAL LOW (ref 12.0–15.0)
MCH: 28 pg (ref 26.0–34.0)
MCHC: 30.6 g/dL (ref 30.0–36.0)
MCV: 91.6 fL (ref 80.0–100.0)
Platelets: 365 10*3/uL (ref 150–400)
RBC: 3.82 MIL/uL — ABNORMAL LOW (ref 3.87–5.11)
RDW: 12.7 % (ref 11.5–15.5)
WBC: 6.2 10*3/uL (ref 4.0–10.5)
nRBC: 0 % (ref 0.0–0.2)

## 2018-10-24 LAB — BASIC METABOLIC PANEL
Anion gap: 13 (ref 5–15)
BUN: 15 mg/dL (ref 6–20)
CO2: 18 mmol/L — ABNORMAL LOW (ref 22–32)
Calcium: 9.5 mg/dL (ref 8.9–10.3)
Chloride: 111 mmol/L (ref 98–111)
Creatinine, Ser: 0.78 mg/dL (ref 0.44–1.00)
GFR calc Af Amer: >60 mL/min (ref >60)
GFR calc non Af Amer: >60 mL/min (ref >60)
Glucose, Bld: 70 mg/dL (ref 70–99)
Potassium: 3.9 mmol/L (ref 3.5–5.1)
Sodium: 142 mmol/L (ref 135–145)

## 2018-10-24 LAB — SURGICAL PCR SCREEN
MRSA, PCR: NEGATIVE
Staphylococcus aureus: NEGATIVE

## 2018-10-24 LAB — SARS CORONAVIRUS 2 (TAT 6-24 HRS): SARS Coronavirus 2: NEGATIVE

## 2018-10-24 NOTE — Anesthesia Preprocedure Evaluation (Addendum)
Anesthesia Evaluation  Patient identified by MRN, date of birth, ID band Patient awake    Reviewed: Allergy & Precautions, NPO status , Patient's Chart, lab work & pertinent test results  History of Anesthesia Complications Negative for: history of anesthetic complications  Airway Mallampati: II  TM Distance: >3 FB Neck ROM: Full    Dental  (+) Poor Dentition, Chipped, Dental Advisory Given, Missing   Pulmonary former smoker,  10/24/2018 SARS coronavirus NEG   breath sounds clear to auscultation       Cardiovascular hypertension, Pt. on medications (-) angina Rhythm:Regular Rate:Normal     Neuro/Psych negative neurological ROS     GI/Hepatic GERD  Medicated and Controlled,(+)     substance abuse  alcohol use,   Endo/Other  negative endocrine ROS  Renal/GU negative Renal ROS     Musculoskeletal   Abdominal   Peds  Hematology   Anesthesia Other Findings   Reproductive/Obstetrics                            Anesthesia Physical Anesthesia Plan  ASA: III  Anesthesia Plan: General   Post-op Pain Management: GA combined w/ Regional for post-op pain   Induction: Intravenous  PONV Risk Score and Plan: 3 and Ondansetron and Dexamethasone  Airway Management Planned: Oral ETT  Additional Equipment:   Intra-op Plan:   Post-operative Plan: Extubation in OR  Informed Consent: I have reviewed the patients History and Physical, chart, labs and discussed the procedure including the risks, benefits and alternatives for the proposed anesthesia with the patient or authorized representative who has indicated his/her understanding and acceptance.     Dental advisory given  Plan Discussed with: CRNA and Surgeon  Anesthesia Plan Comments: (PAT note written 10/24/2018 by Myra Gianotti, PA-C. Plan routine monitors, GETA with popliteal and saphenous blocks for post op analgesia )        Anesthesia Quick Evaluation
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
## 2018-10-24 NOTE — Progress Notes (Signed)
PCP - [facility] ADULT IN [place],NO PARTICULAR MD     Cardiologist - NA       Chest x-ray - NA EKG - 7/20 Stress Test - NA   -            : Aspirin Instructions:STOP  ERAS Protcol -YES PRE-SURGERY Ensure - YES  COVID TEST- TODAY   Anesthesia review: REVIEW EKG    ALLISON AWARE SINCE SURGERY IS TOMORROW  Patient denies shortness of breath, fever, cough and chest pain at PAT appointment   Patient verbalized understanding of instructions that were given to them at the PAT appointment. Patient was also instructed that they will need to review over the PAT instructions again at home before surgery.

## 2018-10-25 ENCOUNTER — Ambulatory Visit (HOSPITAL_COMMUNITY): Payer: Medicaid Other

## 2018-10-25 ENCOUNTER — Encounter (HOSPITAL_COMMUNITY): Payer: Self-pay | Admitting: *Deleted

## 2018-10-25 ENCOUNTER — Ambulatory Visit (HOSPITAL_COMMUNITY)
Admission: RE | Admit: 2018-10-25 | Discharge: 2018-10-25 | Disposition: A | Discharge status: Home / Self Care | Payer: Medicaid Other | Attending: Student | Admitting: Student

## 2018-10-25 ENCOUNTER — Other Ambulatory Visit: Payer: Self-pay

## 2018-10-25 ENCOUNTER — Ambulatory Visit (HOSPITAL_COMMUNITY): Payer: Medicaid Other | Admitting: Certified Registered"

## 2018-10-25 ENCOUNTER — Encounter (HOSPITAL_COMMUNITY)
Admission: RE | Disposition: A | Discharge status: Home / Self Care | Payer: Self-pay | Source: Home / Self Care | Attending: Student

## 2018-10-25 DIAGNOSIS — K219 Gastro-esophageal reflux disease without esophagitis: Secondary | ICD-10-CM | POA: Insufficient documentation

## 2018-10-25 DIAGNOSIS — S92011A Displaced fracture of body of right calcaneus, initial encounter for closed fracture: Secondary | ICD-10-CM | POA: Diagnosis not present

## 2018-10-25 DIAGNOSIS — Z800 Family history of malignant neoplasm of digestive organs: Secondary | ICD-10-CM | POA: Insufficient documentation

## 2018-10-25 DIAGNOSIS — Z87891 Personal history of nicotine dependence: Secondary | ICD-10-CM | POA: Insufficient documentation

## 2018-10-25 DIAGNOSIS — Z820 Family history of epilepsy and other diseases of the nervous system: Secondary | ICD-10-CM | POA: Diagnosis not present

## 2018-10-25 DIAGNOSIS — X58XXXA Exposure to other specified factors, initial encounter: Secondary | ICD-10-CM | POA: Insufficient documentation

## 2018-10-25 DIAGNOSIS — T148XXA Other injury of unspecified body region, initial encounter: Secondary | ICD-10-CM

## 2018-10-25 DIAGNOSIS — S92011P Displaced fracture of body of right calcaneus, subsequent encounter for fracture with malunion: Secondary | ICD-10-CM

## 2018-10-25 DIAGNOSIS — T84223A Displacement of internal fixation device of bones of foot and toes, initial encounter: Secondary | ICD-10-CM | POA: Insufficient documentation

## 2018-10-25 DIAGNOSIS — Z880 Allergy status to penicillin: Secondary | ICD-10-CM | POA: Diagnosis not present

## 2018-10-25 DIAGNOSIS — E876 Hypokalemia: Secondary | ICD-10-CM | POA: Diagnosis not present

## 2018-10-25 DIAGNOSIS — Z419 Encounter for procedure for purposes other than remedying health state, unspecified: Secondary | ICD-10-CM

## 2018-10-25 DIAGNOSIS — Z811 Family history of alcohol abuse and dependence: Secondary | ICD-10-CM | POA: Diagnosis not present

## 2018-10-25 DIAGNOSIS — I10 Essential (primary) hypertension: Secondary | ICD-10-CM | POA: Insufficient documentation

## 2018-10-25 DIAGNOSIS — T8484XA Pain due to internal orthopedic prosthetic devices, implants and grafts, initial encounter: Secondary | ICD-10-CM | POA: Insufficient documentation

## 2018-10-25 DIAGNOSIS — Z825 Family history of asthma and other chronic lower respiratory diseases: Secondary | ICD-10-CM | POA: Diagnosis not present

## 2018-10-25 DIAGNOSIS — Z808 Family history of malignant neoplasm of other organs or systems: Secondary | ICD-10-CM | POA: Insufficient documentation

## 2018-10-25 DIAGNOSIS — Z79899 Other long term (current) drug therapy: Secondary | ICD-10-CM | POA: Diagnosis not present

## 2018-10-25 HISTORY — PX: ORIF CALCANEOUS FRACTURE: SHX5030

## 2018-10-25 SURGERY — OPEN REDUCTION INTERNAL FIXATION (ORIF) CALCANEOUS FRACTURE
Anesthesia: Regional | Laterality: Right

## 2018-10-25 MED ORDER — PROMETHAZINE HCL 25 MG/ML IJ SOLN: 6.2500 mg | INTRAMUSCULAR | Status: DC | PRN

## 2018-10-25 MED ORDER — CHLORHEXIDINE GLUCONATE 4 % EX LIQD: 60.0000 mL | Freq: Once | CUTANEOUS | Status: DC

## 2018-10-25 MED ORDER — 0.9 % SODIUM CHLORIDE (POUR BTL) OPTIME
TOPICAL | Status: DC | PRN
  Administered 2018-10-25: 1000 mL

## 2018-10-25 MED ORDER — CEFAZOLIN SODIUM-DEXTROSE 2-4 GM/100ML-% IV SOLN
INTRAVENOUS | Status: AC
  Filled 2018-10-25: qty 100

## 2018-10-25 MED ORDER — ROCURONIUM BROMIDE 50 MG/5ML IV SOSY
PREFILLED_SYRINGE | INTRAVENOUS | Status: DC | PRN
  Administered 2018-10-25: 50 mg via INTRAVENOUS

## 2018-10-25 MED ORDER — LACTATED RINGERS IV SOLN
INTRAVENOUS | Status: DC | PRN
  Administered 2018-10-25: 08:00:00 via INTRAVENOUS

## 2018-10-25 MED ORDER — DEXAMETHASONE SODIUM PHOSPHATE 10 MG/ML IJ SOLN
INTRAMUSCULAR | Status: AC
  Filled 2018-10-25: qty 1

## 2018-10-25 MED ORDER — MIDAZOLAM HCL 5 MG/5ML IJ SOLN
INTRAMUSCULAR | Status: DC | PRN
  Administered 2018-10-25 (×2): 1 mg via INTRAVENOUS

## 2018-10-25 MED ORDER — FENTANYL CITRATE (PF) 250 MCG/5ML IJ SOLN
INTRAMUSCULAR | Status: AC
  Filled 2018-10-25: qty 5

## 2018-10-25 MED ORDER — MEPERIDINE HCL 25 MG/ML IJ SOLN: 6.2500 mg | INTRAMUSCULAR | Status: DC | PRN

## 2018-10-25 MED ORDER — OXYCODONE HCL 5 MG PO TABS: 5.0000 mg | ORAL_TABLET | ORAL | 0 refills | Status: DC | PRN

## 2018-10-25 MED ORDER — ASPIRIN EC 325 MG PO TBEC: 325.0000 mg | DELAYED_RELEASE_TABLET | Freq: Every day | ORAL | 0 refills | Status: AC

## 2018-10-25 MED ORDER — SUGAMMADEX SODIUM 200 MG/2ML IV SOLN
INTRAVENOUS | Status: DC | PRN
  Administered 2018-10-25: 150 mg via INTRAVENOUS

## 2018-10-25 MED ORDER — VANCOMYCIN HCL 1000 MG IV SOLR
INTRAVENOUS | Status: DC | PRN
  Administered 2018-10-25: 1000 mg via TOPICAL

## 2018-10-25 MED ORDER — MIDAZOLAM HCL 2 MG/2ML IJ SOLN
INTRAMUSCULAR | Status: AC
  Filled 2018-10-25: qty 2

## 2018-10-25 MED ORDER — DEXAMETHASONE SODIUM PHOSPHATE 10 MG/ML IJ SOLN
INTRAMUSCULAR | Status: DC | PRN
  Administered 2018-10-25: 8 mg via INTRAVENOUS

## 2018-10-25 MED ORDER — LIDOCAINE 2% (20 MG/ML) 5 ML SYRINGE
INTRAMUSCULAR | Status: DC | PRN
  Administered 2018-10-25: 80 mg via INTRAVENOUS

## 2018-10-25 MED ORDER — CEFAZOLIN SODIUM-DEXTROSE 2-3 GM-%(50ML) IV SOLR
INTRAVENOUS | Status: DC | PRN
  Administered 2018-10-25: 2 g via INTRAVENOUS

## 2018-10-25 MED ORDER — PROPOFOL 10 MG/ML IV BOLUS
INTRAVENOUS | Status: DC | PRN
  Administered 2018-10-25: 140 mg via INTRAVENOUS

## 2018-10-25 MED ORDER — VANCOMYCIN HCL 1000 MG IV SOLR
INTRAVENOUS | Status: AC
  Filled 2018-10-25: qty 1000

## 2018-10-25 MED ORDER — SODIUM CHLORIDE 0.9 % IV SOLN
INTRAVENOUS | Status: DC | PRN
  Administered 2018-10-25: 60 ug/min via INTRAVENOUS

## 2018-10-25 MED ORDER — ONDANSETRON HCL 4 MG/2ML IJ SOLN
INTRAMUSCULAR | Status: DC | PRN
  Administered 2018-10-25: 4 mg via INTRAVENOUS

## 2018-10-25 MED ORDER — ROPIVACAINE HCL 7.5 MG/ML IJ SOLN
INTRAMUSCULAR | Status: DC | PRN
  Administered 2018-10-25: 20 mL via PERINEURAL

## 2018-10-25 MED ORDER — ONDANSETRON HCL 4 MG/2ML IJ SOLN
INTRAMUSCULAR | Status: AC
  Filled 2018-10-25: qty 2

## 2018-10-25 MED ORDER — FENTANYL CITRATE (PF) 100 MCG/2ML IJ SOLN
INTRAMUSCULAR | Status: DC | PRN
  Administered 2018-10-25 (×4): 50 ug via INTRAVENOUS

## 2018-10-25 MED ORDER — PROPOFOL 10 MG/ML IV BOLUS
INTRAVENOUS | Status: AC
  Filled 2018-10-25: qty 20

## 2018-10-25 MED ORDER — MIDAZOLAM HCL 2 MG/2ML IJ SOLN: 0.5000 mg | Freq: Once | INTRAMUSCULAR | Status: DC | PRN

## 2018-10-25 MED ORDER — HYDROMORPHONE HCL 1 MG/ML IJ SOLN: 0.2500 mg | INTRAMUSCULAR | Status: DC | PRN

## 2018-10-25 MED ORDER — ROCURONIUM BROMIDE 10 MG/ML (PF) SYRINGE
PREFILLED_SYRINGE | INTRAVENOUS | Status: AC
  Filled 2018-10-25: qty 10

## 2018-10-25 MED ORDER — BUPIVACAINE-EPINEPHRINE (PF) 0.5% -1:200000 IJ SOLN
INTRAMUSCULAR | Status: DC | PRN
  Administered 2018-10-25: 30 mL via PERINEURAL

## 2018-10-25 MED ORDER — SUCCINYLCHOLINE CHLORIDE 200 MG/10ML IV SOSY
PREFILLED_SYRINGE | INTRAVENOUS | Status: AC
  Filled 2018-10-25: qty 10

## 2018-10-25 MED ORDER — PHENYLEPHRINE 40 MCG/ML (10ML) SYRINGE FOR IV PUSH (FOR BLOOD PRESSURE SUPPORT)
PREFILLED_SYRINGE | INTRAVENOUS | Status: DC | PRN
  Administered 2018-10-25: 80 ug via INTRAVENOUS
  Administered 2018-10-25: 120 ug via INTRAVENOUS

## 2018-10-25 MED ORDER — PHENYLEPHRINE 40 MCG/ML (10ML) SYRINGE FOR IV PUSH (FOR BLOOD PRESSURE SUPPORT)
PREFILLED_SYRINGE | INTRAVENOUS | Status: AC
  Filled 2018-10-25: qty 10

## 2018-10-25 MED ORDER — LIDOCAINE 2% (20 MG/ML) 5 ML SYRINGE
INTRAMUSCULAR | Status: AC
  Filled 2018-10-25: qty 5

## 2018-10-25 MED ORDER — GLYCOPYRROLATE PF 0.2 MG/ML IJ SOSY
PREFILLED_SYRINGE | INTRAMUSCULAR | Status: AC
  Filled 2018-10-25: qty 1

## 2018-10-25 SURGICAL SUPPLY — 77 items
BANDAGE ESMARK 6X9 LF (GAUZE/BANDAGES/DRESSINGS) ×1 IMPLANT
BIT DRILL 4.8X200 CANN (BIT) ×3 IMPLANT
BIT DRILL CALIBRATED 2.7 (BIT) ×2 IMPLANT
BIT DRILL CALIBRATED 2.7MM (BIT) ×1
BLADE SURG 10 STRL SS (BLADE) ×3 IMPLANT
BNDG COHESIVE 4X5 TAN STRL (GAUZE/BANDAGES/DRESSINGS) ×3 IMPLANT
BNDG ELASTIC 4X5.8 VLCR STR LF (GAUZE/BANDAGES/DRESSINGS) ×6 IMPLANT
BNDG ELASTIC 6X5.8 VLCR STR LF (GAUZE/BANDAGES/DRESSINGS) ×3 IMPLANT
BNDG ESMARK 6X9 LF (GAUZE/BANDAGES/DRESSINGS) ×3
BONE CANC CHIPS 40CC CAN1/2 (Bone Implant) ×3 IMPLANT
BRUSH SCRUB EZ PLAIN DRY (MISCELLANEOUS) ×6 IMPLANT
CHIPS CANC BONE 40CC CAN1/2 (Bone Implant) ×1 IMPLANT
CHLORAPREP W/TINT 26 (MISCELLANEOUS) ×3 IMPLANT
CONNECTOR 5 IN 1 STRAIGHT STRL (MISCELLANEOUS) ×3 IMPLANT
COVER MAYO STAND STRL (DRAPES) ×3 IMPLANT
COVER SURGICAL LIGHT HANDLE (MISCELLANEOUS) ×3 IMPLANT
COVER WAND RF STERILE (DRAPES) IMPLANT
DRAPE C-ARM 42X72 X-RAY (DRAPES) ×3 IMPLANT
DRAPE C-ARMOR (DRAPES) ×3 IMPLANT
DRAPE INCISE IOBAN 66X45 STRL (DRAPES) ×3 IMPLANT
DRAPE ORTHO SPLIT 77X108 STRL (DRAPES) ×4
DRAPE SURG ORHT 6 SPLT 77X108 (DRAPES) ×2 IMPLANT
DRAPE U-SHAPE 47X51 STRL (DRAPES) ×3 IMPLANT
DRSG ADAPTIC 3X8 NADH LF (GAUZE/BANDAGES/DRESSINGS) ×3 IMPLANT
DRSG EMULSION OIL 3X3 NADH (GAUZE/BANDAGES/DRESSINGS) ×3 IMPLANT
ELECT REM PT RETURN 9FT ADLT (ELECTROSURGICAL) ×3
ELECTRODE REM PT RTRN 9FT ADLT (ELECTROSURGICAL) ×1 IMPLANT
GAUZE SPONGE 4X4 12PLY STRL (GAUZE/BANDAGES/DRESSINGS) ×3 IMPLANT
GLOVE BIO SURGEON STRL SZ 6.5 (GLOVE) ×6 IMPLANT
GLOVE BIO SURGEON STRL SZ7.5 (GLOVE) ×12 IMPLANT
GLOVE BIO SURGEONS STRL SZ 6.5 (GLOVE) ×3
GLOVE BIOGEL PI IND STRL 6.5 (GLOVE) ×1 IMPLANT
GLOVE BIOGEL PI IND STRL 7.5 (GLOVE) ×1 IMPLANT
GLOVE BIOGEL PI INDICATOR 6.5 (GLOVE) ×2
GLOVE BIOGEL PI INDICATOR 7.5 (GLOVE) ×2
GOWN STRL REUS W/ TWL LRG LVL3 (GOWN DISPOSABLE) ×2 IMPLANT
GOWN STRL REUS W/TWL LRG LVL3 (GOWN DISPOSABLE) ×4
K-WIRE ACE 1.6X6 (WIRE) ×6
KIT BASIN OR (CUSTOM PROCEDURE TRAY) ×3 IMPLANT
KIT TURNOVER KIT B (KITS) ×3 IMPLANT
KWIRE ACE 1.6X6 (WIRE) ×2 IMPLANT
MANIFOLD NEPTUNE II (INSTRUMENTS) ×3 IMPLANT
NEEDLE HYPO 21X1.5 SAFETY (NEEDLE) IMPLANT
NS IRRIG 1000ML POUR BTL (IV SOLUTION) ×3 IMPLANT
PACK ORTHO EXTREMITY (CUSTOM PROCEDURE TRAY) ×3 IMPLANT
PAD ARMBOARD 7.5X6 YLW CONV (MISCELLANEOUS) ×6 IMPLANT
PAD CAST 4YDX4 CTTN HI CHSV (CAST SUPPLIES) ×2 IMPLANT
PADDING CAST COTTON 4X4 STRL (CAST SUPPLIES) ×4
PADDING CAST COTTON 6X4 STRL (CAST SUPPLIES) ×3 IMPLANT
PIN GUIDE DRILL TIP 2.8X300 (DRILL) ×9 IMPLANT
PIN SHANTZ 5MM (PIN) ×3 IMPLANT
PLATE CALC MIS EXTEND SM RT (Plate) ×3 IMPLANT
SCREW 6.5X55MM FT (Screw) ×3 IMPLANT
SCREW CANNFT 6.5X60 (Screw) ×3 IMPLANT
SCREW FULL THREAD 6.5X65MM (Screw) ×3 IMPLANT
SCREW LOCK CORT STAR 3.5X26 (Screw) ×3 IMPLANT
SCREW LOCK CORT STAR 3.5X28 (Screw) ×3 IMPLANT
SCREW LOCK CORT STAR 3.5X38 (Screw) ×3 IMPLANT
SCREW LOCK CORT STAR 3.5X50 (Screw) ×3 IMPLANT
SCREW SHANZ 4.0X60MM (EXFIX) IMPLANT
SCREW T15 LP CORT 3.5X38MM NS (Screw) ×6 IMPLANT
SCREW T15 LP CORT 3.5X44MM NS (Screw) ×3 IMPLANT
SPONGE LAP 18X18 RF (DISPOSABLE) ×3 IMPLANT
SUCTION FRAZIER HANDLE 10FR (MISCELLANEOUS) ×2
SUCTION TUBE FRAZIER 10FR DISP (MISCELLANEOUS) ×1 IMPLANT
SUT ETHILON 3 0 PS 1 (SUTURE) ×6 IMPLANT
SUT VIC AB 0 CT1 27 (SUTURE) ×2
SUT VIC AB 0 CT1 27XBRD ANBCTR (SUTURE) ×1 IMPLANT
SUT VIC AB 2-0 CT1 27 (SUTURE)
SUT VIC AB 2-0 CT1 TAPERPNT 27 (SUTURE) IMPLANT
SYR CONTROL 10ML LL (SYRINGE) IMPLANT
TOWEL GREEN STERILE (TOWEL DISPOSABLE) ×6 IMPLANT
TOWEL GREEN STERILE FF (TOWEL DISPOSABLE) ×3 IMPLANT
TUBE CONNECTING 12'X1/4 (SUCTIONS) ×2
TUBE CONNECTING 12X1/4 (SUCTIONS) ×4 IMPLANT
UNDERPAD 30X30 (UNDERPADS AND DIAPERS) ×3 IMPLANT
WATER STERILE IRR 1000ML POUR (IV SOLUTION) IMPLANT

## 2018-10-25 NOTE — Discharge Instructions (Addendum)
Orthopaedic Trauma Service Discharge Instructions   General Discharge Instructions  WEIGHT BEARING STATUS: Non-weightbearing right leg  RANGE OF MOTION/ACTIVITY: Full knee range of motion. Do NOT walk on splint  Wound Care: Do not remove splint or get splint wet  DVT/PE prophylaxis: Aspirin  Diet: as you were eating previously.  Can use over the counter stool softeners and bowel preparations, such as Miralax, to help with bowel movements.  Narcotics can be constipating.  Be sure to drink plenty of fluids  PAIN MEDICATION USE AND EXPECTATIONS  You have likely been given narcotic medications to help control your pain.  After a traumatic event that results in an fracture (broken bone) with or without surgery, it is ok to use narcotic pain medications to help control one's pain.  We understand that everyone responds to pain differently and each individual patient will be evaluated on a regular basis for the continued need for narcotic medications. Ideally, narcotic medication use should last no more than 6-8 weeks (coinciding with fracture healing).   As a patient it is your responsibility as well to monitor narcotic medication use and report the amount and frequency you use these medications when you come to your office visit.   We would also advise that if you are using narcotic medications, you should take a dose prior to therapy to maximize you participation.  IF YOU ARE ON NARCOTIC MEDICATIONS IT IS NOT PERMISSIBLE TO OPERATE A MOTOR VEHICLE (MOTORCYCLE/CAR/TRUCK/MOPED) OR HEAVY MACHINERY DO NOT MIX NARCOTICS WITH OTHER CNS (CENTRAL NERVOUS SYSTEM) DEPRESSANTS SUCH AS ALCOHOL   STOP SMOKING OR USING NICOTINE PRODUCTS!!!!  As discussed nicotine severely impairs your body's ability to heal surgical and traumatic wounds but also impairs bone healing.  Wounds and bone heal by forming microscopic blood vessels (angiogenesis) and nicotine is a vasoconstrictor (essentially, shrinks blood  vessels).  Therefore, if vasoconstriction occurs to these microscopic blood vessels they essentially disappear and are unable to deliver necessary nutrients to the healing tissue.  This is one modifiable factor that you can do to dramatically increase your chances of healing your injury.    (This means no smoking, no nicotine gum, patches, etc)  DO NOT USE NONSTEROIDAL ANTI-INFLAMMATORY DRUGS (NSAID'S)  Using products such as Advil (ibuprofen), Aleve (naproxen), Motrin (ibuprofen) for additional pain control during fracture healing can delay and/or prevent the healing response.  If you would like to take over the counter (OTC) medication, Tylenol (acetaminophen) is ok.  However, some narcotic medications that are given for pain control contain acetaminophen as well. Therefore, you should not exceed more than 4000 mg of tylenol in a day if you do not have liver disease.  Also note that there are may OTC medicines, such as cold medicines and allergy medicines that my contain tylenol as well.  If you have any questions about medications and/or interactions please ask your doctor/PA or your pharmacist.      ICE AND ELEVATE INJURED/OPERATIVE EXTREMITY  Using ice and elevating the injured extremity above your heart can help with swelling and pain control.  Icing in a pulsatile fashion, such as 20 minutes on and 20 minutes off, can be followed.    Do not place ice directly on skin. Make sure there is a barrier between to skin and the ice pack.    Using frozen items such as frozen peas works well as the conform nicely to the are that needs to be iced.  USE AN ACE WRAP OR TED HOSE FOR SWELLING CONTROL  In addition to icing and elevation, Ace wraps or TED hose are used to help limit and resolve swelling.  It is recommended to use Ace wraps or TED hose until you are informed to stop.    When using Ace Wraps start the wrapping distally (farthest away from the body) and wrap proximally (closer to the  body)   Example: If you had surgery on your leg or thing and you do not have a splint on, start the ace wrap at the toes and work your way up to the thigh        If you had surgery on your upper extremity and do not have a splint on, start the ace wrap at your fingers and work your way up to the upper arm  IF YOU ARE IN A SPLINT OR CAST DO NOT REMOVE IT FOR ANY REASON   If your splint gets wet for any reason please contact the office immediately. You may shower in your splint or cast as long as you keep it dry.  This can be done by wrapping in a cast cover or garbage back (or similar)  Do Not stick any thing down your splint or cast such as pencils, money, or hangers to try and scratch yourself with.  If you feel itchy take benadryl as prescribed on the bottle for itching   CALL THE OFFICE WITH ANY QUESTIONS OR CONCERNS: [phone]   VISIT OUR WEBSITE FOR ADDITIONAL INFORMATION: [email or web address]

## 2018-10-25 NOTE — Interval H&P Note (Signed)
History and Physical Interval Note:  10/25/2018 8:09 AM  Sandra Montoya  has presented today for surgery, with the diagnosis of Right open calcaneus fracture.  The various methods of treatment have been discussed with the patient and family. After consideration of risks, benefits and other options for treatment, the patient has consented to  Procedure(s): REVISION OPEN REDUCTION INTERNAL FIXATION (ORIF) CALCANEOUS FRACTURE WITH SUBTALAR FUSION (Right) as a surgical intervention.  The patient's history has been reviewed, patient examined, no change in status, stable for surgery.  I have reviewed the patient's chart and labs.  Questions were answered to the patient's satisfaction.     Lennette Bihari P Rawlins Stuard
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
## 2018-10-25 NOTE — Anesthesia Procedure Notes (Addendum)
Anesthesia Regional Block: Adductor canal block   Pre-Anesthetic Checklist: ,, timeout performed, Correct Patient, Correct Site, Correct Laterality, Correct Procedure, Correct Position, site marked, Risks and benefits discussed,  Surgical consent,  Pre-op evaluation,  At surgeon's request and post-op pain management  Laterality: Right and Lower  Prep: chloraprep       Needles:  Injection technique: Single-shot  Needle Type: Echogenic Needle     Needle Length: 9cm  Needle Gauge: 21     Additional Needles:   Procedures:,,,, ultrasound used (permanent image in chart),,,,  Narrative:  Start time: 10/25/2018 8:10 AM End time: 10/25/2018 8:17 AM Injection made incrementally with aspirations every 5 mL.  Performed by: Personally  Anesthesiologist: Annye Asa, MD  Additional Notes: Pt identified in Holding room.  Monitors applied. Working IV access confirmed. Sterile prep R thigh.  #21ga ECHOgenic needle into adductor canal with US guidance.  20cc 0.75% Ropivacaine injected incrementally after negative test dose.  Patient asymptomatic, VSS, no heme aspirated, tolerated well.  Jenita Seashore, MD    Adductor  popliteal

## 2018-10-25 NOTE — Anesthesia Procedure Notes (Addendum)
Anesthesia Regional Block: Popliteal block   Pre-Anesthetic Checklist: ,, timeout performed, Correct Patient, Correct Site, Correct Laterality, Correct Procedure, Correct Position, site marked, Risks and benefits discussed,  Surgical consent,  Pre-op evaluation,  At surgeon's request and post-op pain management  Laterality: Right and Lower  Prep: chloraprep       Needles:  Injection technique: Single-shot  Needle Type: Echogenic Stimulator Needle     Needle Length: 9cm  Needle Gauge: 21     Additional Needles:   Procedures:, nerve stimulator,,, ultrasound used (permanent image in chart),,,,   Nerve Stimulator or Paresthesia:  Response: toe twitches, 0.5 mA, 0.1 ms,   Additional Responses:   Narrative:  Start time: 10/25/2018 8:18 AM End time: 10/25/2018 8:24 AM Injection made incrementally with aspirations every 5 mL.  Performed by: Personally  Anesthesiologist: Annye Asa, MD  Additional Notes: Pt identified in Holding room.  Monitors applied. Working IV access confirmed. Sterile prep R lateral knee.  #21ga ECHOgenic PNS to toe twitch at 0.12mA threshold with US guidance.  30cc 0.5% Bupivacaine with 1:200k epi injected incrementally after negative test dose.  Patient asymptomatic, VSS, no heme aspirated, tolerated well.  Jenita Seashore, MD

## 2018-10-25 NOTE — Anesthesia Procedure Notes (Signed)
Procedure Name: Intubation Date/Time: 10/25/2018 8:48 AM Performed by: Orlie Dakin, CRNA Pre-anesthesia Checklist: Patient identified, Emergency Drugs available, Suction available and Patient being monitored Patient Re-evaluated:Patient Re-evaluated prior to induction Oxygen Delivery Method: Circle system utilized Preoxygenation: Pre-oxygenation with 100% oxygen Induction Type: IV induction Ventilation: Mask ventilation without difficulty Laryngoscope Size: Mac and 4 Grade View: Grade II Tube type: Oral Tube size: 7.0 mm Number of attempts: 1 Airway Equipment and Method: Stylet Placement Confirmation: ETT inserted through vocal cords under direct vision,  positive ETCO2 and breath sounds checked- equal and bilateral Secured at: 22 cm Tube secured with: Tape Dental Injury: Teeth and Oropharynx as per pre-operative assessment  Comments: 4x4s bite block used at end of case.

## 2018-10-25 NOTE — Anesthesia Postprocedure Evaluation (Signed)
Anesthesia Post Note  Patient: RUBBIE GOOSTREE  Procedure(s) Performed: REVISION OPEN REDUCTION INTERNAL FIXATION (ORIF) CALCANEOUS FRACTURE WITH SUBTALAR FUSION (Right )     Patient location during evaluation: PACU Anesthesia Type: Regional Level of consciousness: awake and alert, patient cooperative and oriented Pain management: pain level controlled Vital Signs Assessment: post-procedure vital signs reviewed and stable Respiratory status: spontaneous breathing, nonlabored ventilation and respiratory function stable Cardiovascular status: blood pressure returned to baseline and stable Postop Assessment: no apparent nausea or vomiting Anesthetic complications: no    Last Vitals:  Vitals:   10/25/18 1133 10/25/18 1157  BP: (!) 146/86   Pulse: 78   Resp: 15 20  Temp:    SpO2: 100% 100%    Last Pain:  Vitals:   10/25/18 1157  PainSc: 0-No pain        RLE Motor Response: Purposeful movement (10/25/18 1157) RLE Sensation: Decreased (10/25/18 1157)      Codie Hainer,E. Sylvester Minton

## 2018-10-25 NOTE — Transfer of Care (Signed)
Immediate Anesthesia Transfer of Care Note  Patient: Sandra Montoya  Procedure(s) Performed: REVISION OPEN REDUCTION INTERNAL FIXATION (ORIF) CALCANEOUS FRACTURE WITH SUBTALAR FUSION (Right )  Patient Location: PACU  Anesthesia Type:General and Regional  Level of Consciousness: awake, alert , oriented and patient cooperative  Airway & Oxygen Therapy: Patient Spontanous Breathing and Patient connected to nasal cannula oxygen  Post-op Assessment: Report given to RN and Post -op Vital signs reviewed and stable  Post vital signs: Reviewed and stable  Last Vitals:  Vitals Value Taken Time  BP 140/74 10/25/18 1103  Temp    Pulse 88 10/25/18 1103  Resp 19 10/25/18 1103  SpO2 100 % 10/25/18 1103  Vitals shown include unvalidated device data.  Last Pain:  Vitals:   10/25/18 0653  PainSc: 5          Complications: No apparent anesthesia complications

## 2018-10-26 ENCOUNTER — Encounter (HOSPITAL_COMMUNITY): Payer: Self-pay | Admitting: Student

## 2019-05-09 ENCOUNTER — Other Ambulatory Visit (HOSPITAL_COMMUNITY): Payer: Self-pay | Admitting: Student

## 2019-05-09 ENCOUNTER — Other Ambulatory Visit: Payer: Self-pay | Admitting: Student

## 2019-05-09 ENCOUNTER — Other Ambulatory Visit (HOSPITAL_BASED_OUTPATIENT_CLINIC_OR_DEPARTMENT_OTHER): Payer: Self-pay | Admitting: Student

## 2019-05-09 DIAGNOSIS — S92001D Unspecified fracture of right calcaneus, subsequent encounter for fracture with routine healing: Secondary | ICD-10-CM

## 2019-05-15 ENCOUNTER — Ambulatory Visit (HOSPITAL_BASED_OUTPATIENT_CLINIC_OR_DEPARTMENT_OTHER)
Admission: RE | Admit: 2019-05-15 | Discharge: 2019-05-15 | Disposition: A | Discharge status: Home / Self Care | Payer: Medicaid Other | Source: Ambulatory Visit | Attending: Student | Admitting: Student

## 2019-05-15 ENCOUNTER — Other Ambulatory Visit: Payer: Self-pay

## 2019-05-15 DIAGNOSIS — S92001D Unspecified fracture of right calcaneus, subsequent encounter for fracture with routine healing: Secondary | ICD-10-CM | POA: Diagnosis present

## 2020-05-30 ENCOUNTER — Other Ambulatory Visit (HOSPITAL_COMMUNITY)
Admission: RE | Admit: 2020-05-30 | Discharge: 2020-05-30 | Disposition: A | Discharge status: Home / Self Care | Payer: Medicaid Other | Source: Ambulatory Visit | Attending: Oral Surgery | Admitting: Oral Surgery

## 2020-05-30 ENCOUNTER — Other Ambulatory Visit: Payer: Self-pay

## 2020-05-30 ENCOUNTER — Encounter (HOSPITAL_COMMUNITY): Payer: Self-pay | Admitting: Oral Surgery

## 2020-05-30 DIAGNOSIS — Z20822 Contact with and (suspected) exposure to covid-19: Secondary | ICD-10-CM | POA: Diagnosis not present

## 2020-05-30 DIAGNOSIS — Z01812 Encounter for preprocedural laboratory examination: Secondary | ICD-10-CM | POA: Diagnosis not present

## 2020-05-30 NOTE — Progress Notes (Addendum)
Ms. Fuhrmann denies chest pain or shortness of breath. Patient was tested for Covid and has been in quarantine since that time.  PCP is Dr Tana Felts with a [facility] practice in [facility].

## 2020-05-31 LAB — SARS CORONAVIRUS 2 (TAT 6-24 HRS): SARS Coronavirus 2: NEGATIVE

## 2020-06-03 ENCOUNTER — Other Ambulatory Visit: Payer: Self-pay

## 2020-06-03 ENCOUNTER — Ambulatory Visit (HOSPITAL_COMMUNITY): Payer: Medicaid Other | Admitting: Anesthesiology

## 2020-06-03 ENCOUNTER — Encounter (HOSPITAL_COMMUNITY): Payer: Self-pay | Admitting: Oral Surgery

## 2020-06-03 ENCOUNTER — Ambulatory Visit (HOSPITAL_COMMUNITY)
Admission: RE | Admit: 2020-06-03 | Discharge: 2020-06-03 | Disposition: A | Discharge status: Home / Self Care | Payer: Medicaid Other | Attending: Oral Surgery | Admitting: Oral Surgery

## 2020-06-03 ENCOUNTER — Encounter (HOSPITAL_COMMUNITY)
Admission: RE | Disposition: A | Discharge status: Home / Self Care | Payer: Self-pay | Source: Home / Self Care | Attending: Oral Surgery

## 2020-06-03 DIAGNOSIS — I10 Essential (primary) hypertension: Secondary | ICD-10-CM | POA: Insufficient documentation

## 2020-06-03 DIAGNOSIS — K029 Dental caries, unspecified: Secondary | ICD-10-CM | POA: Insufficient documentation

## 2020-06-03 DIAGNOSIS — K219 Gastro-esophageal reflux disease without esophagitis: Secondary | ICD-10-CM | POA: Diagnosis not present

## 2020-06-03 DIAGNOSIS — Z880 Allergy status to penicillin: Secondary | ICD-10-CM | POA: Insufficient documentation

## 2020-06-03 DIAGNOSIS — M270 Developmental disorders of jaws: Secondary | ICD-10-CM | POA: Diagnosis not present

## 2020-06-03 DIAGNOSIS — Z79899 Other long term (current) drug therapy: Secondary | ICD-10-CM | POA: Insufficient documentation

## 2020-06-03 DIAGNOSIS — Z87891 Personal history of nicotine dependence: Secondary | ICD-10-CM | POA: Diagnosis not present

## 2020-06-03 HISTORY — DX: Diverticulosis of intestine, part unspecified, without perforation or abscess without bleeding: K57.90

## 2020-06-03 HISTORY — DX: Headache, unspecified: R51.9

## 2020-06-03 HISTORY — PX: TOOTH EXTRACTION: SHX859

## 2020-06-03 HISTORY — DX: Vitamin D deficiency, unspecified: E55.9

## 2020-06-03 HISTORY — DX: Depression, unspecified: F32.A

## 2020-06-03 HISTORY — DX: Gastro-esophageal reflux disease without esophagitis: K21.9

## 2020-06-03 HISTORY — DX: Inflammatory liver disease, unspecified: K75.9

## 2020-06-03 LAB — COMPREHENSIVE METABOLIC PANEL
ALT: 14 U/L (ref 0–44)
AST: 16 U/L (ref 15–41)
Albumin: 4.1 g/dL (ref 3.5–5.0)
Alkaline Phosphatase: 65 U/L (ref 38–126)
Anion gap: 10 (ref 5–15)
BUN: 17 mg/dL (ref 8–23)
CO2: 24 mmol/L (ref 22–32)
Calcium: 9.4 mg/dL (ref 8.9–10.3)
Chloride: 105 mmol/L (ref 98–111)
Creatinine, Ser: 0.94 mg/dL (ref 0.44–1.00)
GFR, Estimated: >60 mL/min (ref >60)
Glucose, Bld: 94 mg/dL (ref 70–99)
Potassium: 4 mmol/L (ref 3.5–5.1)
Sodium: 139 mmol/L (ref 135–145)
Total Bilirubin: 0.9 mg/dL (ref 0.3–1.2)
Total Protein: 7.5 g/dL (ref 6.5–8.1)

## 2020-06-03 LAB — CBC
HCT: 40.4 % (ref 36.0–46.0)
Hemoglobin: 12.6 g/dL (ref 12.0–15.0)
MCH: 27 pg (ref 26.0–34.0)
MCHC: 31.2 g/dL (ref 30.0–36.0)
MCV: 86.7 fL (ref 80.0–100.0)
Platelets: 380 10*3/uL (ref 150–400)
RBC: 4.66 MIL/uL (ref 3.87–5.11)
RDW: 14.9 % (ref 11.5–15.5)
WBC: 7.3 10*3/uL (ref 4.0–10.5)
nRBC: 0 % (ref 0.0–0.2)

## 2020-06-03 SURGERY — DENTAL RESTORATION/EXTRACTIONS
Anesthesia: General

## 2020-06-03 MED ORDER — LIDOCAINE-EPINEPHRINE 2 %-1:100000 IJ SOLN
INTRAMUSCULAR | Status: DC | PRN
  Administered 2020-06-03: 20 mL via INTRADERMAL

## 2020-06-03 MED ORDER — CLINDAMYCIN PHOSPHATE 600 MG/50ML IV SOLN
600.0000 mg | INTRAVENOUS | Status: AC
  Administered 2020-06-03: 600 mg via INTRAVENOUS
  Filled 2020-06-03: qty 50

## 2020-06-03 MED ORDER — ROCURONIUM BROMIDE 10 MG/ML (PF) SYRINGE
PREFILLED_SYRINGE | INTRAVENOUS | Status: DC | PRN
  Administered 2020-06-03: 40 mg via INTRAVENOUS

## 2020-06-03 MED ORDER — DEXAMETHASONE SODIUM PHOSPHATE 10 MG/ML IJ SOLN
INTRAMUSCULAR | Status: DC | PRN
  Administered 2020-06-03: 10 mg via INTRAVENOUS

## 2020-06-03 MED ORDER — LACTATED RINGERS IV SOLN: INTRAVENOUS | Status: DC

## 2020-06-03 MED ORDER — FENTANYL CITRATE (PF) 250 MCG/5ML IJ SOLN
INTRAMUSCULAR | Status: DC | PRN
  Administered 2020-06-03: 100 ug via INTRAVENOUS
  Administered 2020-06-03: 50 ug via INTRAVENOUS

## 2020-06-03 MED ORDER — OXYMETAZOLINE HCL 0.05 % NA SOLN
NASAL | Status: AC
  Filled 2020-06-03: qty 30

## 2020-06-03 MED ORDER — SUGAMMADEX SODIUM 200 MG/2ML IV SOLN
INTRAVENOUS | Status: DC | PRN
  Administered 2020-06-03: 200 mg via INTRAVENOUS

## 2020-06-03 MED ORDER — OXYMETAZOLINE HCL 0.05 % NA SOLN
NASAL | Status: DC | PRN
  Administered 2020-06-03 (×2): 2 via NASAL

## 2020-06-03 MED ORDER — CLINDAMYCIN HCL 300 MG PO CAPS: 300.0000 mg | ORAL_CAPSULE | Freq: Three times a day (TID) | ORAL | 0 refills | Status: AC

## 2020-06-03 MED ORDER — ORAL CARE MOUTH RINSE: 15.0000 mL | Freq: Once | OROMUCOSAL | Status: AC

## 2020-06-03 MED ORDER — OXYMETAZOLINE HCL 0.05 % NA SOLN
NASAL | Status: DC | PRN
  Administered 2020-06-03: 1

## 2020-06-03 MED ORDER — FENTANYL CITRATE (PF) 100 MCG/2ML IJ SOLN
INTRAMUSCULAR | Status: AC
  Filled 2020-06-03: qty 2

## 2020-06-03 MED ORDER — DEXAMETHASONE SODIUM PHOSPHATE 10 MG/ML IJ SOLN
INTRAMUSCULAR | Status: AC
  Filled 2020-06-03: qty 1

## 2020-06-03 MED ORDER — LIDOCAINE 2% (20 MG/ML) 5 ML SYRINGE
INTRAMUSCULAR | Status: DC | PRN
  Administered 2020-06-03: 40 mg via INTRAVENOUS

## 2020-06-03 MED ORDER — FENTANYL CITRATE (PF) 100 MCG/2ML IJ SOLN
25.0000 ug | INTRAMUSCULAR | Status: DC | PRN
  Administered 2020-06-03 (×2): 50 ug via INTRAVENOUS

## 2020-06-03 MED ORDER — LIDOCAINE 2% (20 MG/ML) 5 ML SYRINGE
INTRAMUSCULAR | Status: AC
  Filled 2020-06-03: qty 5

## 2020-06-03 MED ORDER — PROPOFOL 10 MG/ML IV BOLUS
INTRAVENOUS | Status: AC
  Filled 2020-06-03: qty 20

## 2020-06-03 MED ORDER — SODIUM CHLORIDE 0.9 % IR SOLN
Status: DC | PRN
  Administered 2020-06-03: 1000 mL

## 2020-06-03 MED ORDER — PROPOFOL 10 MG/ML IV BOLUS
INTRAVENOUS | Status: DC | PRN
  Administered 2020-06-03: 140 mg via INTRAVENOUS

## 2020-06-03 MED ORDER — LIDOCAINE-EPINEPHRINE 2 %-1:100000 IJ SOLN
INTRAMUSCULAR | Status: AC
  Filled 2020-06-03: qty 1

## 2020-06-03 MED ORDER — PROMETHAZINE HCL 25 MG/ML IJ SOLN: 6.2500 mg | INTRAMUSCULAR | Status: DC | PRN

## 2020-06-03 MED ORDER — 0.9 % SODIUM CHLORIDE (POUR BTL) OPTIME
TOPICAL | Status: DC | PRN
  Administered 2020-06-03: 1000 mL

## 2020-06-03 MED ORDER — MIDAZOLAM HCL 2 MG/2ML IJ SOLN
INTRAMUSCULAR | Status: DC | PRN
  Administered 2020-06-03: 2 mg via INTRAVENOUS

## 2020-06-03 MED ORDER — ONDANSETRON HCL 4 MG/2ML IJ SOLN
INTRAMUSCULAR | Status: AC
  Filled 2020-06-03: qty 2

## 2020-06-03 MED ORDER — FENTANYL CITRATE (PF) 250 MCG/5ML IJ SOLN
INTRAMUSCULAR | Status: AC
  Filled 2020-06-03: qty 5

## 2020-06-03 MED ORDER — CHLORHEXIDINE GLUCONATE 0.12 % MT SOLN
OROMUCOSAL | Status: AC
  Administered 2020-06-03: 15 mL via OROMUCOSAL
  Filled 2020-06-03: qty 15

## 2020-06-03 MED ORDER — ONDANSETRON HCL 4 MG/2ML IJ SOLN
INTRAMUSCULAR | Status: DC | PRN
  Administered 2020-06-03: 4 mg via INTRAVENOUS

## 2020-06-03 MED ORDER — ROCURONIUM BROMIDE 10 MG/ML (PF) SYRINGE
PREFILLED_SYRINGE | INTRAVENOUS | Status: AC
  Filled 2020-06-03: qty 10

## 2020-06-03 MED ORDER — CHLORHEXIDINE GLUCONATE 0.12 % MT SOLN: 15.0000 mL | Freq: Once | OROMUCOSAL | Status: AC

## 2020-06-03 MED ORDER — MIDAZOLAM HCL 2 MG/2ML IJ SOLN
INTRAMUSCULAR | Status: AC
  Filled 2020-06-03: qty 2

## 2020-06-03 MED ORDER — OXYCODONE HCL 5 MG PO TABS: 5.0000 mg | ORAL_TABLET | ORAL | 0 refills | Status: AC | PRN

## 2020-06-03 SURGICAL SUPPLY — 37 items
BLADE SURG 15 STRL LF DISP TIS (BLADE) ×1 IMPLANT
BLADE SURG 15 STRL SS (BLADE) ×1
BUR CROSS CUT FISSURE 1.6 (BURR) ×2 IMPLANT
BUR EGG ELITE 4.0 (BURR) ×2 IMPLANT
CANISTER SUCT 3000ML PPV (MISCELLANEOUS) ×2 IMPLANT
COVER SURGICAL LIGHT HANDLE (MISCELLANEOUS) ×2 IMPLANT
COVER WAND RF STERILE (DRAPES) IMPLANT
DECANTER SPIKE VIAL GLASS SM (MISCELLANEOUS) ×2 IMPLANT
DRAPE U-SHAPE 76X120 STRL (DRAPES) ×2 IMPLANT
GAUZE PACKING FOLDED 2  STR (GAUZE/BANDAGES/DRESSINGS) ×1
GAUZE PACKING FOLDED 2 STR (GAUZE/BANDAGES/DRESSINGS) ×1 IMPLANT
GLOVE BIO SURGEON STRL SZ 6.5 (GLOVE) IMPLANT
GLOVE BIO SURGEON STRL SZ7 (GLOVE) IMPLANT
GLOVE BIO SURGEON STRL SZ8 (GLOVE) ×2 IMPLANT
GLOVE SURG UNDER POLY LF SZ6.5 (GLOVE) IMPLANT
GLOVE SURG UNDER POLY LF SZ7 (GLOVE) IMPLANT
GOWN STRL REUS W/ TWL LRG LVL3 (GOWN DISPOSABLE) ×1 IMPLANT
GOWN STRL REUS W/ TWL XL LVL3 (GOWN DISPOSABLE) ×1 IMPLANT
GOWN STRL REUS W/TWL LRG LVL3 (GOWN DISPOSABLE) ×1
GOWN STRL REUS W/TWL XL LVL3 (GOWN DISPOSABLE) ×1
IV NS 1000ML (IV SOLUTION) ×1
IV NS 1000ML BAXH (IV SOLUTION) ×1 IMPLANT
KIT BASIN OR (CUSTOM PROCEDURE TRAY) ×2 IMPLANT
KIT TURNOVER KIT B (KITS) ×2 IMPLANT
NDL HYPO 25GX1X1/2 BEV (NEEDLE) ×2 IMPLANT
NEEDLE HYPO 25GX1X1/2 BEV (NEEDLE) ×4 IMPLANT
NS IRRIG 1000ML POUR BTL (IV SOLUTION) ×2 IMPLANT
PAD ARMBOARD 7.5X6 YLW CONV (MISCELLANEOUS) ×2 IMPLANT
SLEEVE IRRIGATION ELITE 7 (MISCELLANEOUS) ×2 IMPLANT
SPONGE SURGIFOAM ABS GEL 12-7 (HEMOSTASIS) IMPLANT
SUT CHROMIC 3 0 PS 2 (SUTURE) ×2 IMPLANT
SUT CHROMIC 4 0 P 3 18 (SUTURE) ×1 IMPLANT
SYR BULB IRRIG 60ML STRL (SYRINGE) ×2 IMPLANT
SYR CONTROL 10ML LL (SYRINGE) ×2 IMPLANT
TRAY ENT MC OR (CUSTOM PROCEDURE TRAY) ×2 IMPLANT
TUBING IRRIGATION (MISCELLANEOUS) ×2 IMPLANT
YANKAUER SUCT BULB TIP NO VENT (SUCTIONS) ×2 IMPLANT

## 2020-06-03 NOTE — H&P (Signed)
H&P documentation  -History and Physical Reviewed  -Patient has been re-examined  -No change in the plan of care  Sandra Montoya  

## 2020-06-03 NOTE — Transfer of Care (Signed)
Immediate Anesthesia Transfer of Care Note  Patient: Sandra Montoya  Procedure(s) Performed: DENTAL RESTORATION/EXTRACTIONS OF TEETH SEVEN, AND TEN. rEMOVAL OF MANDIBULAR LINGUAL TORI AND REMOVAL OF PALATAL TORUS (N/A )  Patient Location: PACU  Anesthesia Type:General  Level of Consciousness: drowsy and patient cooperative  Airway & Oxygen Therapy: Patient Spontanous Breathing  Post-op Assessment: Report given to RN and Post -op Vital signs reviewed and stable  Post vital signs: Reviewed and stable  Last Vitals:  Vitals Value Taken Time  BP 140/77 06/03/20 1525  Temp 36.7 C 06/03/20 1525  Pulse 94 06/03/20 1527  Resp 21 06/03/20 1527  SpO2 97 % 06/03/20 1527  Vitals shown include unvalidated device data.  Last Pain:  Vitals:   06/03/20 1121  TempSrc:   PainSc: 6       Patients Stated Pain Goal: 3 (06/03/20 1121)  Complications: No complications documented.

## 2020-06-03 NOTE — Anesthesia Preprocedure Evaluation (Signed)
Anesthesia Evaluation  Patient identified by MRN, date of birth, ID band Patient awake    Reviewed: Allergy & Precautions, NPO status , Patient's Chart, lab work & pertinent test results  Airway Mallampati: II  TM Distance: >3 FB Neck ROM: Full    Dental  (+) Dental Advisory Given   Pulmonary former smoker,    breath sounds clear to auscultation       Cardiovascular hypertension, Pt. on medications  Rhythm:Regular Rate:Normal     Neuro/Psych negative neurological ROS     GI/Hepatic GERD  Medicated,(+) Hepatitis -, C  Endo/Other  negative endocrine ROS  Renal/GU negative Renal ROS     Musculoskeletal   Abdominal   Peds  Hematology negative hematology ROS (+)   Anesthesia Other Findings   Reproductive/Obstetrics                             Anesthesia Physical Anesthesia Plan  ASA: II  Anesthesia Plan: General   Post-op Pain Management:    Induction: Intravenous  PONV Risk Score and Plan: 3 and Midazolam, Dexamethasone, Ondansetron and Treatment may vary due to age or medical condition  Airway Management Planned: Oral ETT  Additional Equipment: None  Intra-op Plan:   Post-operative Plan: Extubation in OR  Informed Consent: I have reviewed the patients History and Physical, chart, labs and discussed the procedure including the risks, benefits and alternatives for the proposed anesthesia with the patient or authorized representative who has indicated his/her understanding and acceptance.     Dental advisory given  Plan Discussed with: CRNA  Anesthesia Plan Comments:         Anesthesia Quick Evaluation

## 2020-06-03 NOTE — Anesthesia Procedure Notes (Addendum)
Procedure Name: Intubation Date/Time: 06/03/2020 2:07 PM Performed by: Shary Decamp, CRNA Pre-anesthesia Checklist: Patient identified, Emergency Drugs available, Suction available and Patient being monitored Patient Re-evaluated:Patient Re-evaluated prior to induction Oxygen Delivery Method: Circle system utilized Preoxygenation: Pre-oxygenation with 100% oxygen Induction Type: IV induction Ventilation: Mask ventilation without difficulty Laryngoscope Size: Miller and 2 Nasal Tubes: Nasal prep performed, Nasal Rae and Right Tube size: 6.5 mm Placement Confirmation: ETT inserted through vocal cords under direct vision,  positive ETCO2 and breath sounds checked- equal and bilateral Tube secured with: Tape Dental Injury: Teeth and Oropharynx as per pre-operative assessment
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
## 2020-06-03 NOTE — Op Note (Signed)
06/03/2020  3:15 PM  PATIENT:  Sandra Montoya  60 y.o. female  PRE-OPERATIVE DIAGNOSIS:  NON RESTORABLE TEETH # 7, 10; PALATAL TORUS, BILATERAL MANDIBULAR LINGUAL TORI.  POST-OPERATIVE DIAGNOSIS:  SAME  PROCEDURE:  Procedure(s): DENTAL EXTRACTIONS OF TEETH SEVEN, AND TEN. REMOVAL OF MANDIBULAR LINGUAL TORI AND REMOVAL OF PALATAL TORUS  SURGEON:  Surgeon(s): Ocie Doyne, [place] [phone] ANESTHESIA:   local and general  EBL:  minimal  DRAINS: none   SPECIMEN:  No Specimen  COUNTS:  YES  PLAN OF CARE: Discharge to home after PACU  PATIENT DISPOSITION:  PACU - hemodynamically stable.   PROCEDURE DETAILS: Dictation #  Georgia Lopes, DMD 06/03/2020 3:15 PM

## 2020-06-04 ENCOUNTER — Encounter (HOSPITAL_COMMUNITY): Payer: Self-pay | Admitting: Oral Surgery

## 2020-06-04 NOTE — Op Note (Signed)
NAME: Sandra Montoya, SHOR MEDICAL RECORD NO: [id number] ACCOUNT NO: [id number] DATE OF BIRTH: 1960-11-02 FACILITY: [facility] LOCATION: [facility] PHYSICIAN: Georgia Lopes, DDS  Operative Report   DATE OF PROCEDURE: 06/03/2020  PREOPERATIVE DIAGNOSES:  Nonrestorable teeth secondary to dental caries numbers 7 and 10, maxillary palatal torus, bilateral mandibular lingual tori.  POSTOPERATIVE DIAGNOSES:  Nonrestorable teeth secondary to dental caries numbers 7 and 10, maxillary palatal torus, bilateral mandibular lingual tori.  PROCEDURE:  Dental extractions teeth numbers 7 and 10, removal of palatal torus and removal of mandibular lingual tori.  SURGEON:  Ocie Doyne, DDS  ANESTHESIA:  General, nasal intubation.  Dr. Marcene Duos, attending.  DESCRIPTION OF PROCEDURE:  The patient was taken to the operating room.  Anesthesia was administered.  Nasal endotracheal tube was placed and secured.  The eyes were protected.  The patient was draped.  Timeout was performed.  The posterior pharynx was  suctioned and a throat pack was placed.  2% lidocaine with 1:100,000 epinephrine was infiltrated and an inferior alveolar block on the right and left sides and in buccal and palatal infiltration around teeth numbers 7 and 10 and palatally around the  palatal torus.  Additional buccal anesthesia was given in the mandible anteriorly.  A bite block was placed on the right side of the mouth and a sweetheart retractor was used to retract the tongue.  A 15 blade was used to make an incision along the  alveolar crest, which was edentulous distal to tooth #20.  The incision was started at the area of approximately the second molar, carried forward on the alveolar crest to the distal surface of tooth #20.  Then, the incision was carried lingually in the  gingival sulcus until the embrasure between teeth numbers 24 and 25 was reached.  The periosteum was reflected from around these teeth to expose the lingual torus.   Once the torus was exposed, a Seldin retractor was placed to retract the lingual tissues  and the torus was removed using a fissure bur to section the torus and then the dental elevator was used to fracture off portions of bone and then when the majority of the torus had been removed via this technique, the egg bur was placed in the Stryker  handpiece and the lingual aspect of the mandible was smoothed and then the bone file was used to further smooth this area, then the left mandible was irrigated and closed with 3-0 chromic.  The bite block was repositioned to the other side of the mouth.   A 15 blade was used to make an incision at the alveolar crest in the area of the second molar, carried forward along the alveolar crest to tooth #28 where it was taken lingually into the lingual sulcus between the teeth until adjoining the other  incision that was created between 24 and 25.  The periosteum was reflected to expose the lingual tori.  The tori was sectioned using the Stryker handpiece with a fissure bur under irrigation and once the fragments were removed, then the egg bur was used  to smooth the bone as well as the bone file.  Then this area was irrigated and closed with 3-0 chromic.  The bite block was repositioned back towards the left side.  A 15 blade was used to make an incision around teeth numbers 7 and 10.  The teeth were  elevated and removed from the mouth with a dental forceps.  No sutures were necessary.  The palatal torus was  then removed using a double Y incision, starting at the posterior aspect of the hard palate and carried forward to the incisive papilla.  The  periosteum was reflected with the dental periosteal elevator as well as various Freer elevators, the large proximal torus approximately 2 x 2 cm was removed by using the fissure bur under irrigation to cross hatch approximately 7 mm squares and then  these squares were fractured with the dental elevator and removed with a rongeur.   Then, the remaining bone was smoothed with the egg bur, taking care to avoid damage to the lateral palatal tissue with a Electronics engineer.  Then, the anterior portion of  the palatal torus was reduced also with the rongeurs and the egg bur.  Once the palatal torus was reduced, it was palpated and found to be smooth.  The soft tissue was draped over the torus and sutured using 4-0 interrupted and interlocking sutures.   Then, the oral cavity was irrigated and suctioned.  The throat pack was removed.  The patient was left under the care of anesthesia for extubation and transport to recovery room with plans for discharge home through day surgery.  ESTIMATED BLOOD LOSS:  Minimum.  COMPLICATIONS:  None.  SPECIMENS:  None.   SHW D: 06/03/2020 3:21:43 pm T: 06/04/2020 5:31:00 am  JOB: [id number]/ [id number]

## 2020-06-04 NOTE — Anesthesia Postprocedure Evaluation (Signed)
Anesthesia Post Note  Patient: Sandra Montoya  Procedure(s) Performed: DENTAL RESTORATION/EXTRACTIONS OF TEETH SEVEN, AND TEN. rEMOVAL OF MANDIBULAR LINGUAL TORI AND REMOVAL OF PALATAL TORUS (N/A )     Patient location during evaluation: PACU Anesthesia Type: General Level of consciousness: awake and alert Pain management: pain level controlled Vital Signs Assessment: post-procedure vital signs reviewed and stable Respiratory status: spontaneous breathing, nonlabored ventilation, respiratory function stable and patient connected to nasal cannula oxygen Cardiovascular status: blood pressure returned to baseline and stable Postop Assessment: no apparent nausea or vomiting Anesthetic complications: no   No complications documented.  Last Vitals:  Vitals:   06/03/20 1611 06/03/20 1626  BP: (!) 148/70 (!) 146/81  Pulse: 92 87  Resp: 20 19  Temp:  (!) 36.1 C  SpO2: (!) 88% 98%    Last Pain:  Vitals:   06/03/20 1600  TempSrc:   PainSc: 9                  Kennieth Rad
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
